# Patient Record
Sex: Male | Born: 1965 | Race: White | Hispanic: No | Marital: Married | Smoking: Never smoker
Health system: Southern US, Community
[De-identification: ages and names within clinical notes are randomized; demographics above are authoritative.]

## PROBLEM LIST (undated history)

## (undated) DIAGNOSIS — T7840XA Allergy, unspecified, initial encounter: Secondary | ICD-10-CM

## (undated) DIAGNOSIS — C801 Malignant (primary) neoplasm, unspecified: Secondary | ICD-10-CM

## (undated) DIAGNOSIS — C61 Malignant neoplasm of prostate: Secondary | ICD-10-CM

## (undated) HISTORY — DX: Malignant neoplasm of prostate: C61

## (undated) HISTORY — DX: Allergy, unspecified, initial encounter: T78.40XA

## (undated) HISTORY — DX: Malignant (primary) neoplasm, unspecified: C80.1

---

## 2006-10-31 ENCOUNTER — Ambulatory Visit: Payer: Self-pay | Admitting: Family Medicine

## 2006-11-07 ENCOUNTER — Ambulatory Visit: Payer: Self-pay | Admitting: Family Medicine

## 2007-11-10 ENCOUNTER — Ambulatory Visit: Payer: Self-pay | Admitting: Family Medicine

## 2008-11-18 ENCOUNTER — Ambulatory Visit: Payer: Self-pay | Admitting: Family Medicine

## 2009-11-19 ENCOUNTER — Ambulatory Visit: Payer: Self-pay | Admitting: Family Medicine

## 2010-10-21 ENCOUNTER — Encounter: Payer: Self-pay | Admitting: Family Medicine

## 2010-11-23 ENCOUNTER — Encounter (INDEPENDENT_AMBULATORY_CARE_PROVIDER_SITE_OTHER): Payer: BC Managed Care – PPO | Admitting: Family Medicine

## 2010-11-23 ENCOUNTER — Encounter: Payer: Self-pay | Admitting: Family Medicine

## 2010-11-23 DIAGNOSIS — Z0000 Encounter for general adult medical examination without abnormal findings: Secondary | ICD-10-CM

## 2010-11-23 DIAGNOSIS — J309 Allergic rhinitis, unspecified: Secondary | ICD-10-CM

## 2010-11-24 ENCOUNTER — Telehealth: Payer: Self-pay

## 2010-11-24 NOTE — Telephone Encounter (Signed)
Talked with pt about labs and mailed him a copy

## 2011-11-26 ENCOUNTER — Ambulatory Visit (INDEPENDENT_AMBULATORY_CARE_PROVIDER_SITE_OTHER): Payer: BC Managed Care – PPO | Admitting: Family Medicine

## 2011-11-26 ENCOUNTER — Encounter: Payer: Self-pay | Admitting: Family Medicine

## 2011-11-26 VITALS — BP 110/70 | HR 60 | Resp 12 | Ht 75.0 in | Wt 184.0 lb

## 2011-11-26 DIAGNOSIS — Z23 Encounter for immunization: Secondary | ICD-10-CM

## 2011-11-26 DIAGNOSIS — Z0000 Encounter for general adult medical examination without abnormal findings: Secondary | ICD-10-CM

## 2011-11-26 DIAGNOSIS — J309 Allergic rhinitis, unspecified: Secondary | ICD-10-CM

## 2011-11-26 LAB — HEMOCCULT GUIAC POC 1CARD (OFFICE)

## 2011-11-26 LAB — POCT URINALYSIS DIPSTICK
Blood, UA: NEGATIVE
Glucose, UA: NEGATIVE
Ketones, UA: NEGATIVE
Nitrite, UA: NEGATIVE
Spec Grav, UA: 1.015
pH, UA: 6

## 2011-11-26 NOTE — Patient Instructions (Signed)
Keep taking good care of yourself 

## 2011-11-26 NOTE — Progress Notes (Signed)
Subjective:    Patient ID: Jason Hinton, male    DOB: 04/27/1966, 46 y.o.   MRN: [id number]  HPI He is here for a complete examination. He does have underlying allergies and is using Claritin and Rhinocort with good results. He continues on a multivitamin. He has had some difficulty with abdominal gas and bloating that he has had for several years panel he was evaluated in the past for this and the evaluation was negative. He has made efforts to look at his food intake as well as possible celiac disease but to no avail. Any food because he abdominal bloating and gas. His marriage and home life are going well. 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   Review of Systems  Constitutional: Negative.   HENT: Negative.   Eyes: Negative.   Respiratory: Negative.   Cardiovascular: Negative.   Gastrointestinal: Positive for abdominal distention.  Genitourinary: Negative.   Musculoskeletal: Negative.   Skin: Negative.   Neurological: Negative.   Hematological: Negative.   Psychiatric/Behavioral: Negative.        Objective:   Physical Exam BP 110/70  Pulse 60  Resp 12  Ht 6\' 3"  (1.905 m)  Wt 184 lb (83.462 kg)  BMI 23.00 kg/m2  SpO2 98%  General Appearance:    Alert, cooperative, no distress, appears stated age  Head:    Normocephalic, without obvious abnormality, atraumatic  Eyes:    PERRL, conjunctiva/corneas clear, EOM's intact, fundi    benign  Ears:    Normal TM's and external ear canals  Nose:   Nares normal, mucosa normal, no drainage or sinus   tenderness  Throat:   Lips, mucosa, and tongue normal; teeth and gums normal  Neck:   Supple, no lymphadenopathy;  thyroid:  no   enlargement/tenderness/nodules; no carotid   bruit or JVD  Back:    Spine nontender, no curvature, ROM normal, no CVA     tenderness  Lungs:     Clear to auscultation bilaterally without wheezes, rales or     ronchi; respirations unlabored  Chest Wall:    No tenderness or deformity   Heart:    Regular rate and rhythm, S1 and S2 normal, no murmur, rub   or gallop  Breast Exam:    No chest wall tenderness, masses or gynecomastia  Abdomen:     Soft, non-tender, nondistended, normoactive bowel sounds,    no masses, no hepatosplenomegaly  Genitalia:    Normal male external genitalia without lesions.  Testicles without masses.  No inguinal hernias.  Rectal:    Normal sphincter tone, no masses or tenderness; guaiac negative stool.  Prostate smooth, no nodules, not enlarged.  Extremities:   No clubbing, cyanosis or edema  Pulses:   2+ and symmetric all extremities  Skin:   Skin color, texture, turgor normal, no rashes or lesions  Lymph nodes:   Cervical, supraclavicular, and axillary nodes normal  Neurologic:   CNII-XII intact, normal strength, sensation and gait; reflexes 2+ and symmetric throughout          Psych:   Normal mood, affect, hygiene and grooming.          Assessment & Plan:   1. Routine general medical examination at a health care facility  POCT Urinalysis Dipstick, Hemoccult - 1 Card (office)  2. Need for prophylactic vaccination and inoculation against other combinations of diseases  Tdap vaccine greater than or equal to 7yo IM  3. Allergic rhinitis, mild  I did recommend that he try Gas-X for his abdominal bloating to see if this will help. I will do routine blood screening on him next year. Encouraged him to continue with his active lifestyle.

## 2012-07-03 ENCOUNTER — Other Ambulatory Visit: Payer: BC Managed Care – PPO

## 2013-09-06 ENCOUNTER — Ambulatory Visit (INDEPENDENT_AMBULATORY_CARE_PROVIDER_SITE_OTHER): Payer: BC Managed Care – PPO | Admitting: Family Medicine

## 2013-09-06 ENCOUNTER — Encounter: Payer: Self-pay | Admitting: Family Medicine

## 2013-09-06 VITALS — BP 120/78 | HR 64 | Ht 76.0 in | Wt 194.0 lb

## 2013-09-06 DIAGNOSIS — J309 Allergic rhinitis, unspecified: Secondary | ICD-10-CM

## 2013-09-06 DIAGNOSIS — Z0000 Encounter for general adult medical examination without abnormal findings: Secondary | ICD-10-CM

## 2013-09-06 LAB — CBC WITH DIFFERENTIAL/PLATELET
Basophils Absolute: 0 10*3/uL (ref 0.0–0.1)
Basophils Relative: 1 % (ref 0–1)
EOS PCT: 3 % (ref 0–5)
Eosinophils Absolute: 0.1 10*3/uL (ref 0.0–0.7)
HEMATOCRIT: 43.4 % (ref 39.0–52.0)
Hemoglobin: 14.8 g/dL (ref 13.0–17.0)
LYMPHS ABS: 1 10*3/uL (ref 0.7–4.0)
LYMPHS PCT: 27 % (ref 12–46)
MCH: 29.6 pg (ref 26.0–34.0)
MCHC: 34.1 g/dL (ref 30.0–36.0)
MCV: 86.8 fL (ref 78.0–100.0)
Monocytes Absolute: 0.3 10*3/uL (ref 0.1–1.0)
Monocytes Relative: 8 % (ref 3–12)
NEUTROS ABS: 2.3 10*3/uL (ref 1.7–7.7)
Neutrophils Relative %: 61 % (ref 43–77)
PLATELETS: 160 10*3/uL (ref 150–400)
RBC: 5 MIL/uL (ref 4.22–5.81)
RDW: 13.6 % (ref 11.5–15.5)
WBC: 3.8 10*3/uL — AB (ref 4.0–10.5)

## 2013-09-06 LAB — POCT URINALYSIS DIPSTICK
Bilirubin, UA: NEGATIVE
Blood, UA: NEGATIVE
GLUCOSE UA: NEGATIVE
KETONES UA: NEGATIVE
LEUKOCYTES UA: NEGATIVE
Nitrite, UA: NEGATIVE
Protein, UA: NEGATIVE
SPEC GRAV UA: 1.01
UROBILINOGEN UA: NEGATIVE
pH, UA: 6

## 2013-09-06 LAB — COMPREHENSIVE METABOLIC PANEL
ALBUMIN: 4.7 g/dL (ref 3.5–5.2)
ALT: 22 U/L (ref 0–53)
AST: 20 U/L (ref 0–37)
Alkaline Phosphatase: 37 U/L — ABNORMAL LOW (ref 39–117)
BUN: 17 mg/dL (ref 6–23)
CALCIUM: 9.7 mg/dL (ref 8.4–10.5)
CHLORIDE: 104 meq/L (ref 96–112)
CO2: 30 meq/L (ref 19–32)
CREATININE: 1.05 mg/dL (ref 0.50–1.35)
Glucose, Bld: 95 mg/dL (ref 70–99)
POTASSIUM: 4.1 meq/L (ref 3.5–5.3)
Sodium: 140 mEq/L (ref 135–145)
TOTAL PROTEIN: 6.7 g/dL (ref 6.0–8.3)
Total Bilirubin: 0.8 mg/dL (ref 0.2–1.2)

## 2013-09-06 LAB — HEMOCCULT GUIAC POC 1CARD (OFFICE)

## 2013-09-06 LAB — LIPID PANEL
CHOL/HDL RATIO: 4.3 ratio
CHOLESTEROL: 212 mg/dL — AB (ref 0–200)
HDL: 49 mg/dL (ref >39)
LDL Cholesterol: 134 mg/dL — ABNORMAL HIGH (ref 0–99)
Triglycerides: 147 mg/dL (ref <150)
VLDL: 29 mg/dL (ref 0–40)

## 2013-09-06 NOTE — Progress Notes (Signed)
   Subjective:    Patient ID: Jason Hinton, male    DOB: 1965/11/04, 48 y.o.   MRN: [id number]  HPI He is here for complete examination. He does have underlying allergies and gets medications from [place] which apparently he can get because it's less expensive. He has no other concerns or complaints. He does not smoke, exercises regularly drinks very rarely. His work and home life are going well family and social history were reviewed. Immunizations were reviewed   Review of Systems  All other systems reviewed and are negative.       Objective:   Physical Exam BP 120/78  Pulse 64  Ht 6\' 4"  (1.93 m)  Wt 194 lb (87.998 kg)  BMI 23.62 kg/m2  General Appearance:    Alert, cooperative, no distress, appears stated age  Head:    Normocephalic, without obvious abnormality, atraumatic  Eyes:    PERRL, conjunctiva/corneas clear, EOM's intact, fundi    benign  Ears:    Normal TM's and external ear canals  Nose:   Nares normal, mucosa normal, no drainage or sinus   tenderness  Throat:   Lips, mucosa, and tongue normal; teeth and gums normal  Neck:   Supple, no lymphadenopathy;  thyroid:  no   enlargement/tenderness/nodules; no carotid   bruit or JVD  Back:    Spine nontender, no curvature, ROM normal, no CVA     tenderness  Lungs:     Clear to auscultation bilaterally without wheezes, rales or     ronchi; respirations unlabored  Chest Wall:    No tenderness or deformity   Heart:    Regular rate and rhythm, S1 and S2 normal, no murmur, rub   or gallop  Breast Exam:    No chest wall tenderness, masses or gynecomastia  Abdomen:     Soft, non-tender, nondistended, normoactive bowel sounds,    no masses, no hepatosplenomegaly  Genitalia:    Normal male external genitalia without lesions.  Testicles without masses.  No inguinal hernias.  Rectal:    Normal sphincter tone, no masses or tenderness; guaiac negative stool.  Prostate smooth, no nodules, not enlarged.  Extremities:   No clubbing,  cyanosis or edema  Pulses:   2+ and symmetric all extremities  Skin:   Skin color, texture, turgor normal, no rashes or lesions  Lymph nodes:   Cervical, supraclavicular, and axillary nodes normal  Neurologic:   CNII-XII intact, normal strength, sensation and gait; reflexes 2+ and symmetric throughout          Psych:   Normal mood, affect, hygiene and grooming.          Assessment & Plan:  Routine general medical examination at a health care facility - Plan: POCT urinalysis dipstick, CBC with Differential, Comprehensive metabolic panel, Lipid panel, POCT occult blood stool  Allergic rhinitis, mild

## 2015-01-27 ENCOUNTER — Ambulatory Visit (INDEPENDENT_AMBULATORY_CARE_PROVIDER_SITE_OTHER): Payer: BLUE CROSS/BLUE SHIELD | Admitting: Family Medicine

## 2015-01-27 ENCOUNTER — Encounter: Payer: Self-pay | Admitting: Family Medicine

## 2015-01-27 VITALS — BP 118/80 | HR 59 | Ht 75.0 in | Wt 200.0 lb

## 2015-01-27 DIAGNOSIS — H60542 Acute eczematoid otitis externa, left ear: Secondary | ICD-10-CM | POA: Diagnosis not present

## 2015-01-27 DIAGNOSIS — Z0000 Encounter for general adult medical examination without abnormal findings: Secondary | ICD-10-CM

## 2015-01-27 DIAGNOSIS — J309 Allergic rhinitis, unspecified: Secondary | ICD-10-CM | POA: Diagnosis not present

## 2015-01-27 DIAGNOSIS — M722 Plantar fascial fibromatosis: Secondary | ICD-10-CM

## 2015-01-27 LAB — COMPREHENSIVE METABOLIC PANEL
ALBUMIN: 4.6 g/dL (ref 3.5–5.2)
ALT: 20 U/L (ref 0–53)
AST: 17 U/L (ref 0–37)
Alkaline Phosphatase: 37 U/L — ABNORMAL LOW (ref 39–117)
BUN: 18 mg/dL (ref 6–23)
CALCIUM: 10 mg/dL (ref 8.4–10.5)
CHLORIDE: 105 meq/L (ref 96–112)
CO2: 26 meq/L (ref 19–32)
CREATININE: 0.92 mg/dL (ref 0.50–1.35)
GLUCOSE: 101 mg/dL — AB (ref 70–99)
Potassium: 4 mEq/L (ref 3.5–5.3)
SODIUM: 141 meq/L (ref 135–145)
Total Bilirubin: 0.7 mg/dL (ref 0.2–1.2)
Total Protein: 6.6 g/dL (ref 6.0–8.3)

## 2015-01-27 LAB — LIPID PANEL
Cholesterol: 234 mg/dL — ABNORMAL HIGH (ref 0–200)
HDL: 45 mg/dL (ref >=40)
LDL CALC: 158 mg/dL — AB (ref 0–99)
Total CHOL/HDL Ratio: 5.2 Ratio
Triglycerides: 157 mg/dL — ABNORMAL HIGH (ref <150)
VLDL: 31 mg/dL (ref 0–40)

## 2015-01-27 LAB — POCT URINALYSIS DIPSTICK
Bilirubin, UA: NEGATIVE
GLUCOSE UA: NEGATIVE
Ketones, UA: NEGATIVE
LEUKOCYTES UA: NEGATIVE
Nitrite, UA: NEGATIVE
PH UA: 6.5
Protein, UA: NEGATIVE
RBC UA: NEGATIVE
Spec Grav, UA: 1.025
Urobilinogen, UA: NEGATIVE

## 2015-01-27 LAB — CBC WITH DIFFERENTIAL/PLATELET
BASOS ABS: 0 10*3/uL (ref 0.0–0.1)
Basophils Relative: 0 % (ref 0–1)
EOS ABS: 0.1 10*3/uL (ref 0.0–0.7)
EOS PCT: 2 % (ref 0–5)
HEMATOCRIT: 43.1 % (ref 39.0–52.0)
Hemoglobin: 15.1 g/dL (ref 13.0–17.0)
LYMPHS ABS: 0.9 10*3/uL (ref 0.7–4.0)
LYMPHS PCT: 19 % (ref 12–46)
MCH: 30.4 pg (ref 26.0–34.0)
MCHC: 35 g/dL (ref 30.0–36.0)
MCV: 86.7 fL (ref 78.0–100.0)
MPV: 10.1 fL (ref 8.6–12.4)
Monocytes Absolute: 0.4 10*3/uL (ref 0.1–1.0)
Monocytes Relative: 9 % (ref 3–12)
Neutro Abs: 3.2 10*3/uL (ref 1.7–7.7)
Neutrophils Relative %: 70 % (ref 43–77)
Platelets: 157 10*3/uL (ref 150–400)
RBC: 4.97 MIL/uL (ref 4.22–5.81)
RDW: 13.3 % (ref 11.5–15.5)
WBC: 4.5 10*3/uL (ref 4.0–10.5)

## 2015-01-27 MED ORDER — BUDESONIDE 32 MCG/ACT NA SUSP: 1.0000 | Freq: Every day | NASAL | Status: DC

## 2015-01-27 NOTE — Progress Notes (Signed)
   Subjective:    Patient ID: Jason Hinton, male    DOB: 09/09/1965, 49 y.o.   MRN: [id number]  HPI He is here for complete examination. He does complain of some itching in the left ear. No drainage, pain or tinnitus.He is also had some left foot specifically heel discomfort. He has changed shoes and did do some stretching for this.His allergies are under good control. He has no other concerns or complaints. He does keep himself quite physically active playing soccer. His work and home life are going quite well. Family and social history as well as health maintenance and immunizations were reviewed.   Review of Systems  All other systems reviewed and are negative.      Objective:   Physical Exam BP 118/80 mmHg  Pulse 59  Ht 6\' 3"  (1.905 m)  Wt 200 lb (90.719 kg)  BMI 25.00 kg/m2  SpO2 97%  General Appearance:    Alert, cooperative, no distress, appears stated age  Head:    Normocephalic, without obvious abnormality, atraumatic  Eyes:    PERRL, conjunctiva/corneas clear, EOM's intact, fundi    benign  Ears:    Normal TM's and external ear canal on the left is slightly erythematous  Nose:   Nares normal, mucosa normal, no drainage or sinus   tenderness  Throat:   Lips, mucosa, and tongue normal; teeth and gums normal  Neck:   Supple, no lymphadenopathy;  thyroid:  no   enlargement/tenderness/nodules; no carotid   bruit or JVD  Back:    Spine nontender, no curvature, ROM normal, no CVA     tenderness  Lungs:     Clear to auscultation bilaterally without wheezes, rales or     ronchi; respirations unlabored  Chest Wall:    No tenderness or deformity   Heart:    Regular rate and rhythm, S1 and S2 normal, no murmur, rub   or gallop  Breast Exam:    No chest wall tenderness, masses or gynecomastia  Abdomen:     Soft, non-tender, nondistended, normoactive bowel sounds,    no masses, no hepatosplenomegaly  Genitalia:    Normal male external genitalia without lesions.  Testicles without  masses.  No inguinal hernias.  Rectal:   Deferred due to age <40 and lack of symptoms  Extremities:   No clubbing, cyanosis or edema.Slight tenderness palpation over the calcaneal spur on the left with normal motion of the foot.  Pulses:   2+ and symmetric all extremities  Skin:   Skin color, texture, turgor normal, no rashes or lesions  Lymph nodes:   Cervical, supraclavicular, and axillary nodes normal  Neurologic:   CNII-XII intact, normal strength, sensation and gait; reflexes 2+ and symmetric throughout          Psych:   Normal mood, affect, hygiene and grooming.          Assessment & Plan:  Routine general medical examination at a health care facility - Plan: POCT Urinalysis Dipstick, CBC with Differential/Platelet, Comprehensive metabolic panel, Lipid panel  Allergic rhinitis, mild - Plan: budesonide (RHINOCORT AQUA) 32 MCG/ACT nasal spray  Plantar fasciitis of left foot  Eczema of left external ear Commend cortisone cream for the ear. Also discussed orthotic, heel cups, stretching for his tendinitis. Also recommend make sure that his shoes fit him properly. Continue on allergy medications.

## 2016-03-30 ENCOUNTER — Ambulatory Visit (INDEPENDENT_AMBULATORY_CARE_PROVIDER_SITE_OTHER): Payer: BLUE CROSS/BLUE SHIELD | Admitting: Family Medicine

## 2016-03-30 ENCOUNTER — Encounter: Payer: Self-pay | Admitting: Family Medicine

## 2016-03-30 VITALS — BP 110/70 | HR 46 | Ht 75.0 in | Wt 198.6 lb

## 2016-03-30 DIAGNOSIS — Z0000 Encounter for general adult medical examination without abnormal findings: Secondary | ICD-10-CM | POA: Diagnosis not present

## 2016-03-30 DIAGNOSIS — L309 Dermatitis, unspecified: Secondary | ICD-10-CM

## 2016-03-30 DIAGNOSIS — J309 Allergic rhinitis, unspecified: Secondary | ICD-10-CM

## 2016-03-30 DIAGNOSIS — E785 Hyperlipidemia, unspecified: Secondary | ICD-10-CM | POA: Diagnosis not present

## 2016-03-30 LAB — POCT URINALYSIS DIPSTICK
Bilirubin, UA: NEGATIVE
Blood, UA: NEGATIVE
GLUCOSE UA: NEGATIVE
KETONES UA: NEGATIVE
Leukocytes, UA: NEGATIVE
Nitrite, UA: NEGATIVE
Protein, UA: NEGATIVE
Spec Grav, UA: >=1.03
UROBILINOGEN UA: NEGATIVE
pH, UA: 6

## 2016-03-30 LAB — LIPID PANEL
CHOL/HDL RATIO: 3.7 ratio (ref <=5.0)
Cholesterol: 206 mg/dL — ABNORMAL HIGH (ref 125–200)
HDL: 56 mg/dL (ref >=40)
LDL Cholesterol: 132 mg/dL — ABNORMAL HIGH (ref <130)
Triglycerides: 90 mg/dL (ref <150)
VLDL: 18 mg/dL (ref <30)

## 2016-03-30 LAB — CBC WITH DIFFERENTIAL/PLATELET
BASOS PCT: 1 %
Basophils Absolute: 44 cells/uL (ref 0–200)
EOS ABS: 132 {cells}/uL (ref 15–500)
Eosinophils Relative: 3 %
HCT: 43.1 % (ref 38.5–50.0)
HEMOGLOBIN: 14.9 g/dL (ref 13.2–17.1)
LYMPHS ABS: 880 {cells}/uL (ref 850–3900)
Lymphocytes Relative: 20 %
MCH: 29.8 pg (ref 27.0–33.0)
MCHC: 34.6 g/dL (ref 32.0–36.0)
MCV: 86.2 fL (ref 80.0–100.0)
MPV: 10 fL (ref 7.5–12.5)
Monocytes Absolute: 396 cells/uL (ref 200–950)
Monocytes Relative: 9 %
NEUTROS PCT: 67 %
Neutro Abs: 2948 cells/uL (ref 1500–7800)
Platelets: 128 10*3/uL — ABNORMAL LOW (ref 140–400)
RBC: 5 MIL/uL (ref 4.20–5.80)
RDW: 12.8 % (ref 11.0–15.0)
WBC: 4.4 10*3/uL (ref 4.0–10.5)

## 2016-03-30 LAB — COMPREHENSIVE METABOLIC PANEL
ALBUMIN: 4.6 g/dL (ref 3.6–5.1)
ALK PHOS: 43 U/L (ref 40–115)
ALT: 16 U/L (ref 9–46)
AST: 20 U/L (ref 10–40)
BILIRUBIN TOTAL: 0.8 mg/dL (ref 0.2–1.2)
BUN: 20 mg/dL (ref 7–25)
CALCIUM: 9.7 mg/dL (ref 8.6–10.3)
CO2: 26 mmol/L (ref 20–31)
Chloride: 106 mmol/L (ref 98–110)
Creat: 1 mg/dL (ref 0.60–1.35)
Glucose, Bld: 100 mg/dL — ABNORMAL HIGH (ref 65–99)
POTASSIUM: 3.9 mmol/L (ref 3.5–5.3)
Sodium: 142 mmol/L (ref 135–146)
Total Protein: 6.6 g/dL (ref 6.1–8.1)

## 2016-03-30 NOTE — Progress Notes (Signed)
   Subjective:    Patient ID: Jason Hinton, male    DOB: 07-Dec-1965, 50 y.o.   MRN: [id number]  HPI He is here for a complete examination he does have difficulty with itching in his ears. Does have underlying allergies and does use OTC medications with good results for this. Also recently he was seen in an urgent care center for urinary type symptoms however at this time he is not having any trouble. He also is now in a new office-based and has had some questionable visual/dizziness type symptoms that he is unclear of. This point they are doing better. Review of his record does show elevated lipids but negative family history for any major cardiac issues. His work and home life are going quite well. Family and social history as well as health maintenance and immunizations were reviewed. He's had no chest pain, shortness of breath, GI issues   Review of Systems  All other systems reviewed and are negative.      Objective:   Physical Exam BP 110/70   Pulse (!) 46   Ht 6\' 3"  (1.905 m)   Wt 198 lb 9.6 oz (90.1 kg)   SpO2 97%   BMI 24.82 kg/m   General Appearance:    Alert, cooperative, no distress, appears stated age  Head:    Normocephalic, without obvious abnormality, atraumatic  Eyes:    PERRL, conjunctiva/corneas clear, EOM's intact, fundi    benign  Ears:    Normal TM's,external ear canals show slight erythema and scaling   Nose:   Nares normal, mucosa normal, no drainage or sinus   tenderness  Throat:   Lips, mucosa, and tongue normal; teeth and gums normal        Lungs:     Clear to auscultation bilaterally without wheezes, rales or     ronchi; respirations unlabored  Chest Wall:    No tenderness or deformity   Heart:    Regular rate and rhythm, S1 and S2 normal, no murmur, rub   or gallop     Abdomen:     Soft, non-tender, nondistended, normoactive bowel sounds,    no masses, no hepatosplenomegaly  Genitalia:    Normal male external genitalia without lesions.  Testicles  without masses.  No inguinal hernias.  Rectal:     Extremities:   No clubbing, cyanosis or edema  Pulses:   2+ and symmetric all extremities  Skin:   Skin color, texture, turgor normal, no rashes or lesions  Lymph nodes:   Cervical, supraclavicular, and axillary nodes normal  Neurologic:   CNII-XII intact, normal strength, sensation and gait; reflexes 2+ and symmetric throughout          Psych:   Normal mood, affect, hygiene and grooming.          Assessment & Plan:  Routine general medical examination at a health care facility - Plan: POCT Urinalysis Dipstick, CBC with Differential/Platelet, Comprehensive metabolic panel, Lipid panel  Allergic rhinitis, mild  Eczema  Hyperlipidemia - Plan: Lipid panel Ackerman cortisone cream for the eczema in his ears. He will continue to treat his allergies as needed. He is taking excellent care of himself. He is also to keep track of his symptoms in regard to work versus the computer. He is nearsighted so that should not be an issue. If he has further difficulty with this he will come back for recheck. He is also to call after he turns 51 for referral for colonoscopy.

## 2017-05-30 ENCOUNTER — Ambulatory Visit (INDEPENDENT_AMBULATORY_CARE_PROVIDER_SITE_OTHER): Payer: BLUE CROSS/BLUE SHIELD | Admitting: Family Medicine

## 2017-05-30 ENCOUNTER — Encounter: Payer: Self-pay | Admitting: Family Medicine

## 2017-05-30 VITALS — BP 112/60 | HR 54 | Resp 16 | Ht 74.5 in | Wt 191.4 lb

## 2017-05-30 DIAGNOSIS — J309 Allergic rhinitis, unspecified: Secondary | ICD-10-CM

## 2017-05-30 DIAGNOSIS — Z0000 Encounter for general adult medical examination without abnormal findings: Secondary | ICD-10-CM | POA: Diagnosis not present

## 2017-05-30 DIAGNOSIS — E785 Hyperlipidemia, unspecified: Secondary | ICD-10-CM

## 2017-05-30 DIAGNOSIS — L309 Dermatitis, unspecified: Secondary | ICD-10-CM

## 2017-05-30 LAB — POCT URINALYSIS DIP (PROADVANTAGE DEVICE)
BILIRUBIN UA: NEGATIVE
GLUCOSE UA: NEGATIVE mg/dL
Leukocytes, UA: NEGATIVE
Nitrite, UA: NEGATIVE
Protein Ur, POC: NEGATIVE mg/dL
RBC UA: NEGATIVE
SPECIFIC GRAVITY, URINE: 1.03
Urobilinogen, Ur: NEGATIVE
pH, UA: 6 (ref 5.0–8.0)

## 2017-05-30 NOTE — Progress Notes (Signed)
   Subjective:    Patient ID: Jason Hinton, male    DOB: [birth year], 51 y.o.   MRN: [id number]  HPI He is here for a complete examination.  He does have underlying allergies and does use Rhinocort.  The seems to have this under adequate control.  He also complains of some difficulty with ear scaling and itching and does use a cortisone cream on this on an as-needed basis.  Otherwise he has no particular concerns or complaints.  He does keep himself physically active.  He is marriage and home life are going well.  Otherwise family and social history as well as health maintenance and immunizations was reviewed.   Review of Systems  All other systems reviewed and are negative.      Objective:   Physical Exam BP 112/60   Pulse (!) 54   Resp 16   Ht 6' 2.5" (1.892 m)   Wt 191 lb 6.4 oz (86.8 kg)   SpO2 99%   BMI 24.25 kg/m   General Appearance:    Alert, cooperative, no distress, appears stated age  Head:    Normocephalic, without obvious abnormality, atraumatic  Eyes:    PERRL, conjunctiva/corneas clear, EOM's intact, fundi    benign  Ears:    Normal TM's and external ear canals  Nose:   Nares normal, mucosa normal, no drainage or sinus   tenderness  Throat:   Lips, mucosa, and tongue normal; teeth and gums normal  Neck:   Supple, no lymphadenopathy;  thyroid:  no   enlargement/tenderness/nodules; no carotid   bruit or JVD     Lungs:     Clear to auscultation bilaterally without wheezes, rales or     ronchi; respirations unlabored      Heart:    Regular rate and rhythm, S1 and S2 normal, no murmur, rub   or gallop     Abdomen:     Soft, non-tender, nondistended, normoactive bowel sounds,    no masses, no hepatosplenomegaly  Genitalia:   Deferred  Rectal:   Deferred  Extremities:   No clubbing, cyanosis or edema  Pulses:   2+ and symmetric all extremities  Skin:   Skin color, texture, turgor normal, no rashes or lesions  Lymph nodes:   Cervical, supraclavicular, and  axillary nodes normal  Neurologic:   CNII-XII intact, normal strength, sensation and gait; reflexes 2+ and symmetric throughout          Psych:   Normal mood, affect, hygiene and grooming.           Assessment & Plan:  Annual physical exam - Plan: POCT Urinalysis DIP (Proadvantage Device), CBC with Differential/Platelet, Comprehensive metabolic panel, Lipid panel  Allergic rhinitis, mild  Eczema, unspecified type  Hyperlipidemia, unspecified hyperlipidemia type - Plan: Lipid panel  Recommend cortisone cream for the eczema things he will continue on his allergy medications.  Discussed getting colonoscopy explained to colonoscopy versus [place]ho guard.  He will wait until next year and hopefully his insurance would not cover this.  Encouraged him to continue to take good care of himself.  Refused flu shot

## 2017-05-31 LAB — COMPREHENSIVE METABOLIC PANEL
AG RATIO: 2 (calc) (ref 1.0–2.5)
ALKALINE PHOSPHATASE (APISO): 45 U/L (ref 40–115)
ALT: 23 U/L (ref 9–46)
AST: 34 U/L (ref 10–35)
Albumin: 4.6 g/dL (ref 3.6–5.1)
BILIRUBIN TOTAL: 1 mg/dL (ref 0.2–1.2)
BUN: 23 mg/dL (ref 7–25)
CALCIUM: 9.6 mg/dL (ref 8.6–10.3)
CHLORIDE: 106 mmol/L (ref 98–110)
CO2: 27 mmol/L (ref 20–32)
Creat: 1.07 mg/dL (ref 0.70–1.33)
GLOBULIN: 2.3 g/dL (ref 1.9–3.7)
Glucose, Bld: 94 mg/dL (ref 65–99)
Potassium: 3.8 mmol/L (ref 3.5–5.3)
Sodium: 140 mmol/L (ref 135–146)
Total Protein: 6.9 g/dL (ref 6.1–8.1)

## 2017-05-31 LAB — CBC WITH DIFFERENTIAL/PLATELET
BASOS PCT: 0.4 %
Basophils Absolute: 20 cells/uL (ref 0–200)
EOS ABS: 122 {cells}/uL (ref 15–500)
Eosinophils Relative: 2.4 %
HEMATOCRIT: 43 % (ref 38.5–50.0)
Hemoglobin: 14.8 g/dL (ref 13.2–17.1)
Lymphs Abs: 979 cells/uL (ref 850–3900)
MCH: 29.4 pg (ref 27.0–33.0)
MCHC: 34.4 g/dL (ref 32.0–36.0)
MCV: 85.5 fL (ref 80.0–100.0)
MPV: 11.1 fL (ref 7.5–12.5)
Monocytes Relative: 8.7 %
NEUTROS PCT: 69.3 %
Neutro Abs: 3534 cells/uL (ref 1500–7800)
PLATELETS: 177 10*3/uL (ref 140–400)
RBC: 5.03 10*6/uL (ref 4.20–5.80)
RDW: 12.2 % (ref 11.0–15.0)
TOTAL LYMPHOCYTE: 19.2 %
WBC: 5.1 10*3/uL (ref 3.8–10.8)
WBCMIX: 444 {cells}/uL (ref 200–950)

## 2017-05-31 LAB — LIPID PANEL
CHOLESTEROL: 223 mg/dL — AB (ref <200)
HDL: 67 mg/dL (ref >40)
LDL Cholesterol (Calc): 141 mg/dL (calc) — ABNORMAL HIGH
Non-HDL Cholesterol (Calc): 156 mg/dL (calc) — ABNORMAL HIGH (ref <130)
Total CHOL/HDL Ratio: 3.3 (calc) (ref <5.0)
Triglycerides: 55 mg/dL (ref <150)

## 2018-06-01 ENCOUNTER — Ambulatory Visit (INDEPENDENT_AMBULATORY_CARE_PROVIDER_SITE_OTHER): Payer: BLUE CROSS/BLUE SHIELD | Admitting: Family Medicine

## 2018-06-01 ENCOUNTER — Encounter: Payer: Self-pay | Admitting: Family Medicine

## 2018-06-01 VITALS — BP 104/72 | HR 60 | Temp 97.6°F | Ht 74.5 in | Wt 184.6 lb

## 2018-06-01 DIAGNOSIS — J309 Allergic rhinitis, unspecified: Secondary | ICD-10-CM

## 2018-06-01 DIAGNOSIS — E785 Hyperlipidemia, unspecified: Secondary | ICD-10-CM | POA: Diagnosis not present

## 2018-06-01 DIAGNOSIS — Z0000 Encounter for general adult medical examination without abnormal findings: Secondary | ICD-10-CM

## 2018-06-01 DIAGNOSIS — Z23 Encounter for immunization: Secondary | ICD-10-CM

## 2018-06-01 DIAGNOSIS — Z1211 Encounter for screening for malignant neoplasm of colon: Secondary | ICD-10-CM | POA: Diagnosis not present

## 2018-06-01 LAB — LIPID PANEL
CHOL/HDL RATIO: 3.9 ratio (ref 0.0–5.0)
Cholesterol, Total: 160 mg/dL (ref 100–199)
HDL: 41 mg/dL (ref >39)
LDL Calculated: 107 mg/dL — ABNORMAL HIGH (ref 0–99)
TRIGLYCERIDES: 58 mg/dL (ref 0–149)
VLDL CHOLESTEROL CAL: 12 mg/dL (ref 5–40)

## 2018-06-01 LAB — POCT URINALYSIS DIP (PROADVANTAGE DEVICE)
Bilirubin, UA: NEGATIVE
GLUCOSE UA: NEGATIVE mg/dL
Ketones, POC UA: NEGATIVE mg/dL
Leukocytes, UA: NEGATIVE
NITRITE UA: NEGATIVE
Protein Ur, POC: NEGATIVE mg/dL
RBC UA: NEGATIVE
Specific Gravity, Urine: 1.025
Urobilinogen, Ur: 3.5
pH, UA: 6 (ref 5.0–8.0)

## 2018-06-01 NOTE — Progress Notes (Signed)
   Subjective:    Patient ID: Jason Hinton, male    DOB: 11-01-1965, 52 y.o.   MRN: [id number]  HPI He is here for complete examination.  He does have underlying allergies that are under good control with Rhinocort and Claritin.  Recently he has had URI symptoms but is getting better.  He is also had intermittent difficulty with constipation and occasionally seeing bright red blood per rectum with that.  He said no abdominal pain, nausea, vomiting. He does have a history of hyperlipidemia but is not on any medications.  He does eat a well-balanced diet and exercises regularly.  His work and home life are going well.  Family and social history as well as health maintenance and immunizations was reviewed.  No family history of colon cancer or polyps.   Review of Systems  All other systems reviewed and are negative.      Objective:   Physical Exam BP 104/72 (BP Location: Left Arm, Patient Position: Sitting)   Pulse 60   Temp 97.6 F (36.4 C)   Ht 6' 2.5" (1.892 m)   Wt 184 lb 9.6 oz (83.7 kg)   SpO2 98%   BMI 23.38 kg/m   General Appearance:    Alert, cooperative, no distress, appears stated age  Head:    Normocephalic, without obvious abnormality, atraumatic  Eyes:    PERRL, conjunctiva/corneas clear, EOM's intact, fundi    benign  Ears:    Normal TM's and external ear canals  Nose:   Nares normal, mucosa normal, no drainage or sinus   tenderness  Throat:   Lips, mucosa, and tongue normal; teeth and gums normal  Neck:   Supple, no lymphadenopathy;  thyroid:  no   enlargement/tenderness/nodules; no carotid   bruit or JVD     Lungs:     Clear to auscultation bilaterally without wheezes, rales or     ronchi; respirations unlabored      Heart:    Regular rate and rhythm, S1 and S2 normal, no murmur, rub   or gallop     Abdomen:     Soft, non-tender, nondistended, normoactive bowel sounds,    no masses, no hepatosplenomegaly  Genitalia:   Deferred  Rectal:    Normal sphincter  tone, no masses or tenderness;   Extremities:   No clubbing, cyanosis or edema  Pulses:   2+ and symmetric all extremities  Skin:   Skin color, texture, turgor normal, no rashes or lesions  Lymph nodes:   Cervical, supraclavicular, and axillary nodes normal  Neurologic:   CNII-XII intact, normal strength, sensation and gait; reflexes 2+ and symmetric throughout          Psych:   Normal mood, affect, hygiene and grooming.          Assessment & Plan:  Routine general medical examination at a health care facility - Plan: POCT Urinalysis DIP (Proadvantage Device)  Allergic rhinitis, mild  Hyperlipidemia, unspecified hyperlipidemia type - Plan: Lipid panel  Screening for colon cancer - Plan: Cologuard  Need for influenza vaccination - Plan: Flu Vaccine QUAD 6+ mos PF IM (Fluarix Quad PF) He will continue on his allergy medications.  I will check his lipids. Explained that the intermittent bright red blood is probably related to constipation and rectal fissure.  Recommend he come here when he has another episode for further evaluation and definitive diagnosis.

## 2018-06-02 ENCOUNTER — Encounter: Payer: Self-pay | Admitting: Family Medicine

## 2018-06-04 LAB — COLOGUARD: Cologuard: NEGATIVE

## 2019-05-03 ENCOUNTER — Other Ambulatory Visit: Payer: Self-pay

## 2019-05-03 DIAGNOSIS — Z20822 Contact with and (suspected) exposure to covid-19: Secondary | ICD-10-CM

## 2019-05-05 LAB — NOVEL CORONAVIRUS, NAA: SARS-CoV-2, NAA: NOT DETECTED

## 2019-06-07 ENCOUNTER — Encounter: Payer: BLUE CROSS/BLUE SHIELD | Admitting: Family Medicine

## 2019-08-15 ENCOUNTER — Other Ambulatory Visit: Payer: Self-pay

## 2019-08-15 ENCOUNTER — Ambulatory Visit (INDEPENDENT_AMBULATORY_CARE_PROVIDER_SITE_OTHER): Payer: 59 | Admitting: Family Medicine

## 2019-08-15 ENCOUNTER — Encounter: Payer: Self-pay | Admitting: Family Medicine

## 2019-08-15 VITALS — BP 102/68 | HR 57 | Temp 96.8°F | Ht 74.5 in | Wt 185.6 lb

## 2019-08-15 DIAGNOSIS — Z0000 Encounter for general adult medical examination without abnormal findings: Secondary | ICD-10-CM | POA: Diagnosis not present

## 2019-08-15 DIAGNOSIS — E785 Hyperlipidemia, unspecified: Secondary | ICD-10-CM | POA: Diagnosis not present

## 2019-08-15 DIAGNOSIS — J309 Allergic rhinitis, unspecified: Secondary | ICD-10-CM

## 2019-08-15 DIAGNOSIS — N419 Inflammatory disease of prostate, unspecified: Secondary | ICD-10-CM

## 2019-08-15 DIAGNOSIS — Z23 Encounter for immunization: Secondary | ICD-10-CM

## 2019-08-15 LAB — POCT URINALYSIS DIP (PROADVANTAGE DEVICE)
Bilirubin, UA: NEGATIVE
Blood, UA: NEGATIVE
Glucose, UA: NEGATIVE mg/dL
Ketones, POC UA: NEGATIVE mg/dL
Leukocytes, UA: NEGATIVE
Nitrite, UA: NEGATIVE
Protein Ur, POC: NEGATIVE mg/dL
Specific Gravity, Urine: 1.02
Urobilinogen, Ur: 0.2
pH, UA: 6 (ref 5.0–8.0)

## 2019-08-15 MED ORDER — SULFAMETHOXAZOLE-TRIMETHOPRIM 800-160 MG PO TABS: 1.0000 | ORAL_TABLET | Freq: Two times a day (BID) | ORAL | 0 refills | Status: DC

## 2019-08-15 NOTE — Progress Notes (Signed)
   Subjective:    Patient ID: Jason Hinton, male    DOB: Apr 04, 1966, 54 y.o.   MRN: [id number]  HPI He is here for complete examination.  He does complain of intermittent difficulty with urinary frequency and slight discomfort but no real dysuria.  He also has noted slight decrease in stream as well as some urgency.  This tends to come and go.  No discharge is noted.  He did note that in November he did see rust colored semen however this did go away relatively quickly.  He has cut back on his bike riding to see if this would help.  He keeps himself very physically active.  He does have underlying allergies and treats them with over-the-counter medications with good results.  Presently he is unemployed due to [employer].  This has been quite stressful on him and his family.  Otherwise family and social history as well as health maintenance and immunizations was reviewed.   Review of Systems  All other systems reviewed and are negative.      Objective:   Physical Exam Alert and in no distress. Tympanic membranes and canals are normal. Pharyngeal area is normal. Neck is supple without adenopathy or thyromegaly. Cardiac exam shows a regular sinus rhythm without murmurs or gallops. Lungs are clear to auscultation.  Abdominal exam shows no hepatosplenomegaly masses or tenderness.  Lowella Fairy shows normal uncircumcised male.  Testes normal.  Rectal exam shows a boggy and slightly tender prostate.  Compression of the prostate reproduces his discomfort.  Urinalysis was negative        Assessment & Plan:  Routine general medical examination at a health care facility - Plan: CBC with Differential/Platelet, Comprehensive metabolic panel, Lipid panel, POCT Urinalysis DIP (Proadvantage Device)  Allergic rhinitis, mild  Hyperlipidemia, unspecified hyperlipidemia type  Need for influenza vaccination - Plan: Flu Vaccine QUAD 6+ mos PF IM (Fluarix Quad PF)  Prostatitis, unspecified prostatitis type - Plan:  sulfamethoxazole-trimethoprim (BACTRIM DS) 800-160 MG tablet I explained that I thought the rose-colored semen and his present symptoms were prostate related.  I will treat him with 2 weeks of antibiotic.  He will call if not entirely back to normal.  Continue on his allergy medications.

## 2019-08-16 LAB — CBC WITH DIFFERENTIAL/PLATELET
Basophils Absolute: 0 10*3/uL (ref 0.0–0.2)
Basos: 1 %
EOS (ABSOLUTE): 0.1 10*3/uL (ref 0.0–0.4)
Eos: 2 %
Hematocrit: 42.5 % (ref 37.5–51.0)
Hemoglobin: 14.9 g/dL (ref 13.0–17.7)
Immature Grans (Abs): 0 10*3/uL (ref 0.0–0.1)
Immature Granulocytes: 0 %
Lymphocytes Absolute: 0.8 10*3/uL (ref 0.7–3.1)
Lymphs: 20 %
MCH: 30.7 pg (ref 26.6–33.0)
MCHC: 35.1 g/dL (ref 31.5–35.7)
MCV: 87 fL (ref 79–97)
Monocytes Absolute: 0.4 10*3/uL (ref 0.1–0.9)
Monocytes: 10 %
Neutrophils Absolute: 2.7 10*3/uL (ref 1.4–7.0)
Neutrophils: 67 %
Platelets: 167 10*3/uL (ref 150–450)
RBC: 4.86 x10E6/uL (ref 4.14–5.80)
RDW: 12.5 % (ref 11.6–15.4)
WBC: 4.1 10*3/uL (ref 3.4–10.8)

## 2019-08-16 LAB — COMPREHENSIVE METABOLIC PANEL
ALT: 19 IU/L (ref 0–44)
AST: 19 IU/L (ref 0–40)
Albumin/Globulin Ratio: 2.5 — ABNORMAL HIGH (ref 1.2–2.2)
Albumin: 4.7 g/dL (ref 3.8–4.9)
Alkaline Phosphatase: 49 IU/L (ref 39–117)
BUN/Creatinine Ratio: 17 (ref 9–20)
BUN: 18 mg/dL (ref 6–24)
Bilirubin Total: 0.6 mg/dL (ref 0.0–1.2)
CO2: 24 mmol/L (ref 20–29)
Calcium: 10 mg/dL (ref 8.7–10.2)
Chloride: 104 mmol/L (ref 96–106)
Creatinine, Ser: 1.03 mg/dL (ref 0.76–1.27)
GFR calc Af Amer: 95 mL/min/{1.73_m2} (ref >59)
GFR calc non Af Amer: 83 mL/min/{1.73_m2} (ref >59)
Globulin, Total: 1.9 g/dL (ref 1.5–4.5)
Glucose: 101 mg/dL — ABNORMAL HIGH (ref 65–99)
Potassium: 4.2 mmol/L (ref 3.5–5.2)
Sodium: 140 mmol/L (ref 134–144)
Total Protein: 6.6 g/dL (ref 6.0–8.5)

## 2019-08-16 LAB — LIPID PANEL
Chol/HDL Ratio: 3.9 ratio (ref 0.0–5.0)
Cholesterol, Total: 241 mg/dL — ABNORMAL HIGH (ref 100–199)
HDL: 62 mg/dL (ref >39)
LDL Chol Calc (NIH): 162 mg/dL — ABNORMAL HIGH (ref 0–99)
Triglycerides: 100 mg/dL (ref 0–149)
VLDL Cholesterol Cal: 17 mg/dL (ref 5–40)

## 2019-09-17 ENCOUNTER — Other Ambulatory Visit: Payer: Self-pay

## 2019-09-17 ENCOUNTER — Encounter (INDEPENDENT_AMBULATORY_CARE_PROVIDER_SITE_OTHER): Payer: Self-pay

## 2019-09-17 ENCOUNTER — Encounter: Payer: Self-pay | Admitting: Family Medicine

## 2019-09-17 DIAGNOSIS — N419 Inflammatory disease of prostate, unspecified: Secondary | ICD-10-CM

## 2020-02-21 ENCOUNTER — Encounter: Payer: Self-pay | Admitting: Family Medicine

## 2020-09-19 DIAGNOSIS — B001 Herpesviral vesicular dermatitis: Secondary | ICD-10-CM | POA: Diagnosis not present

## 2021-04-15 ENCOUNTER — Other Ambulatory Visit: Payer: Self-pay | Admitting: *Deleted

## 2021-04-15 ENCOUNTER — Encounter: Payer: Self-pay | Admitting: Family Medicine

## 2021-04-15 ENCOUNTER — Other Ambulatory Visit: Payer: Self-pay | Admitting: Urology

## 2021-04-15 ENCOUNTER — Other Ambulatory Visit: Payer: Self-pay

## 2021-04-15 DIAGNOSIS — Z125 Encounter for screening for malignant neoplasm of prostate: Secondary | ICD-10-CM

## 2021-04-15 NOTE — Progress Notes (Signed)
Patient: Jason Hinton           Date of Birth: 23-Aug-1965           MRN: [id number] Visit Date: 04/15/2021 PCP: Denita Lung, MD  Prostate Cancer Screening Date of last physical exam: 03/27/20 Date of last rectal exam:  (2021) Have you ever had any of the following?: None Have you ever had or been told you have an allergy to latex products?: No Are you currently taking any natural prostate preparations?: No Are you currently experiencing any urinary symptoms?: No  Prostate Exam Exam not completed. PSA Only.  Patient's History Patient Active Problem List   Diagnosis Date Noted   Hyperlipidemia 08/15/2019   Eczema 05/30/2017   Allergic rhinitis, mild 11/26/2011   Past Medical History:  Diagnosis Date   Allergic rhinitis    Allergy     No family history on file.  Social History   Occupational History   Not on file  Tobacco Use   Smoking status: Not on file   Smokeless tobacco: Never  Vaping Use   Vaping Use: Never used  Substance and Sexual Activity   Alcohol use: Yes    Alcohol/week: 5.0 standard drinks    Types: 5 Cans of beer per week   Drug use: No   Sexual activity: Yes

## 2021-04-16 ENCOUNTER — Telehealth: Payer: Self-pay

## 2021-04-16 LAB — PSA: Prostate Specific Ag, Serum: 4.5 ng/mL — ABNORMAL HIGH (ref 0.0–4.0)

## 2021-04-16 NOTE — Telephone Encounter (Signed)
Attempted to contact patient regarding lab (PSA) results. Left message on voicemail requesting a return call.

## 2021-04-17 ENCOUNTER — Telehealth: Payer: Self-pay

## 2021-04-17 NOTE — Telephone Encounter (Signed)
Patient returned call, was informed PSA level was elevated, needs referral to urologist. Patient was offered referral to Dr. Roxy Horseman office @ [facility],and accepted referral. Referral information completed and sent to Etheleen Sia, RN.

## 2021-04-24 ENCOUNTER — Telehealth: Payer: Self-pay | Admitting: *Deleted

## 2021-04-24 NOTE — Telephone Encounter (Signed)
Referral for elevated PSA sent to [facility] on 04/17/21. Appointment scheduled with Dr. Diona Fanti on 04/27/2021 at 1545. Patient aware of appointment.

## 2021-04-27 DIAGNOSIS — R972 Elevated prostate specific antigen [PSA]: Secondary | ICD-10-CM | POA: Diagnosis not present

## 2021-06-09 DIAGNOSIS — R972 Elevated prostate specific antigen [PSA]: Secondary | ICD-10-CM | POA: Diagnosis not present

## 2021-08-03 ENCOUNTER — Encounter: Payer: 59 | Admitting: Family Medicine

## 2021-08-19 DIAGNOSIS — R972 Elevated prostate specific antigen [PSA]: Secondary | ICD-10-CM | POA: Diagnosis not present

## 2021-08-19 DIAGNOSIS — C61 Malignant neoplasm of prostate: Secondary | ICD-10-CM | POA: Diagnosis not present

## 2021-08-19 HISTORY — PX: SATURATION BIOPSY OF PROSTATE: SHX2375

## 2021-09-03 DIAGNOSIS — C61 Malignant neoplasm of prostate: Secondary | ICD-10-CM | POA: Insufficient documentation

## 2021-09-14 DIAGNOSIS — C61 Malignant neoplasm of prostate: Secondary | ICD-10-CM | POA: Diagnosis not present

## 2021-09-16 ENCOUNTER — Telehealth: Payer: Self-pay | Admitting: *Deleted

## 2021-09-16 NOTE — Telephone Encounter (Signed)
Patient was returning your call. Said he had a biopsy and you had called him to ask if he had any questions. He does want to speak with you. He does know you are out of town and said it is not an emergency. ?

## 2021-09-24 ENCOUNTER — Other Ambulatory Visit: Payer: Self-pay | Admitting: Urology

## 2021-09-24 ENCOUNTER — Encounter: Payer: Self-pay | Admitting: Family Medicine

## 2021-09-24 DIAGNOSIS — C61 Malignant neoplasm of prostate: Secondary | ICD-10-CM

## 2021-10-20 ENCOUNTER — Ambulatory Visit (INDEPENDENT_AMBULATORY_CARE_PROVIDER_SITE_OTHER): Payer: BC Managed Care – PPO | Admitting: Family Medicine

## 2021-10-20 ENCOUNTER — Encounter: Payer: Self-pay | Admitting: Family Medicine

## 2021-10-20 ENCOUNTER — Telehealth: Payer: Self-pay

## 2021-10-20 VITALS — BP 120/70 | HR 62 | Ht 76.0 in | Wt 190.0 lb

## 2021-10-20 DIAGNOSIS — Z0000 Encounter for general adult medical examination without abnormal findings: Secondary | ICD-10-CM | POA: Diagnosis not present

## 2021-10-20 DIAGNOSIS — J309 Allergic rhinitis, unspecified: Secondary | ICD-10-CM

## 2021-10-20 DIAGNOSIS — Z1211 Encounter for screening for malignant neoplasm of colon: Secondary | ICD-10-CM

## 2021-10-20 DIAGNOSIS — Z23 Encounter for immunization: Secondary | ICD-10-CM | POA: Diagnosis not present

## 2021-10-20 DIAGNOSIS — E785 Hyperlipidemia, unspecified: Secondary | ICD-10-CM | POA: Diagnosis not present

## 2021-10-20 DIAGNOSIS — Z630 Problems in relationship with spouse or partner: Secondary | ICD-10-CM

## 2021-10-20 DIAGNOSIS — Z1159 Encounter for screening for other viral diseases: Secondary | ICD-10-CM

## 2021-10-20 DIAGNOSIS — C61 Malignant neoplasm of prostate: Secondary | ICD-10-CM

## 2021-10-20 NOTE — Progress Notes (Signed)
? ?  Subjective:  ? ? Patient ID: Jason Hinton, male    DOB: 1965-09-22, 56 y.o.   MRN: [id number] ? ?HPI ?He is here for a complete examination.  He was diagnosed recently with prostate cancer with a Gleason score of 6.  Apparently his urologist discussed surgery with him.  He then discussed it with physicians in his home country and has decided to do active surveillance.  His allergies seem to be under good control.  He also has a history of hyperlipidemia.  Apparently his wife is interested in him having a hormone panel done.  He cites issues after talking to him which are more psychological in nature.  He and his wife are apparently involved in counseling on that.  He does have a daughter that will be going away to college this year.  Otherwise his family and social history is health maintenance and immunizations was reviewed. ? ? ?Review of Systems  ?All other systems reviewed and are negative. ? ?   ?Objective:  ? Physical Exam ?Alert and in no distress. Tympanic membranes and canals are normal. Pharyngeal area is normal. Neck is supple without adenopathy or thyromegaly. Cardiac exam shows a regular sinus rhythm without murmurs or gallops. Lungs are clear to auscultation. ? ? ? ? ?   ?Assessment & Plan:  ?Routine general medical examination at a health care facility - Plan: CBC with Differential/Platelet, Comprehensive metabolic panel, Lipid panel ? 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 ?Hyperlipidemia, unspecified hyperlipidemia type ? ?Allergic rhinitis, mild ? ?Need for Tdap vaccination - Plan: Tdap vaccine greater than or equal to 7yo IM ? ?Encounter for hepatitis C screening test for low risk patient - Plan: Hepatitis C antibody, CANCELED: Hormone Panel ? ?Screening for colon cancer - Plan: Cologuard ? ?Marital stress ?I discussed the diagnosis of prostate cancer with him and his Gleason score of 6.  I think it is reasonable for him to continue with active surveillance and I will help him to run PSA on him.   1 will be set up in July.  I then discussed the marital stress that he is under and the fact that he is involved in counseling.  Strongly encouraged him to continue with that.  I then looked up hormone panel and most of the items and there are either testosterone or male related issues that would not apply.  He is really not having any testosterone related symptoms of weakness, decreased libido, decreased strength. ?PSA ordered for July. ?

## 2021-10-20 NOTE — Telephone Encounter (Signed)
Pt states he would like PSA drawn in July, can you put in orders if this is ok?

## 2021-10-21 LAB — LIPID PANEL
Chol/HDL Ratio: 3.5 ratio (ref 0.0–5.0)
Cholesterol, Total: 218 mg/dL — ABNORMAL HIGH (ref 100–199)
HDL: 62 mg/dL (ref >39)
LDL Chol Calc (NIH): 132 mg/dL — ABNORMAL HIGH (ref 0–99)
Triglycerides: 134 mg/dL (ref 0–149)
VLDL Cholesterol Cal: 24 mg/dL (ref 5–40)

## 2021-10-21 LAB — CBC WITH DIFFERENTIAL/PLATELET
Basophils Absolute: 0 10*3/uL (ref 0.0–0.2)
Basos: 1 %
EOS (ABSOLUTE): 0.4 10*3/uL (ref 0.0–0.4)
Eos: 6 %
Hematocrit: 46.2 % (ref 37.5–51.0)
Hemoglobin: 15.9 g/dL (ref 13.0–17.7)
Immature Grans (Abs): 0 10*3/uL (ref 0.0–0.1)
Immature Granulocytes: 0 %
Lymphocytes Absolute: 1 10*3/uL (ref 0.7–3.1)
Lymphs: 17 %
MCH: 29.9 pg (ref 26.6–33.0)
MCHC: 34.4 g/dL (ref 31.5–35.7)
MCV: 87 fL (ref 79–97)
Monocytes Absolute: 0.4 10*3/uL (ref 0.1–0.9)
Monocytes: 7 %
Neutrophils Absolute: 4.1 10*3/uL (ref 1.4–7.0)
Neutrophils: 69 %
Platelets: 190 10*3/uL (ref 150–450)
RBC: 5.32 x10E6/uL (ref 4.14–5.80)
RDW: 12.1 % (ref 11.6–15.4)
WBC: 5.9 10*3/uL (ref 3.4–10.8)

## 2021-10-21 LAB — COMPREHENSIVE METABOLIC PANEL
ALT: 18 IU/L (ref 0–44)
AST: 16 IU/L (ref 0–40)
Albumin/Globulin Ratio: 2.5 — ABNORMAL HIGH (ref 1.2–2.2)
Albumin: 4.8 g/dL (ref 3.8–4.9)
Alkaline Phosphatase: 58 IU/L (ref 44–121)
BUN/Creatinine Ratio: 14 (ref 9–20)
BUN: 14 mg/dL (ref 6–24)
Bilirubin Total: 0.5 mg/dL (ref 0.0–1.2)
CO2: 24 mmol/L (ref 20–29)
Calcium: 9.8 mg/dL (ref 8.7–10.2)
Chloride: 105 mmol/L (ref 96–106)
Creatinine, Ser: 1.01 mg/dL (ref 0.76–1.27)
Globulin, Total: 1.9 g/dL (ref 1.5–4.5)
Glucose: 93 mg/dL (ref 70–99)
Potassium: 4.3 mmol/L (ref 3.5–5.2)
Sodium: 140 mmol/L (ref 134–144)
Total Protein: 6.7 g/dL (ref 6.0–8.5)
eGFR: 88 mL/min/{1.73_m2} (ref >59)

## 2021-10-21 LAB — HEPATITIS C ANTIBODY: Hep C Virus Ab: NONREACTIVE

## 2021-10-22 ENCOUNTER — Encounter: Payer: Self-pay | Admitting: Family Medicine

## 2021-10-29 ENCOUNTER — Ambulatory Visit
Admission: RE | Admit: 2021-10-29 | Discharge: 2021-10-29 | Disposition: A | Discharge status: Home / Self Care | Payer: BC Managed Care – PPO | Source: Ambulatory Visit | Attending: Urology | Admitting: Urology

## 2021-10-29 DIAGNOSIS — R590 Localized enlarged lymph nodes: Secondary | ICD-10-CM | POA: Diagnosis not present

## 2021-10-29 DIAGNOSIS — C61 Malignant neoplasm of prostate: Secondary | ICD-10-CM

## 2021-10-29 DIAGNOSIS — Z1211 Encounter for screening for malignant neoplasm of colon: Secondary | ICD-10-CM | POA: Diagnosis not present

## 2021-10-29 MED ORDER — GADOBENATE DIMEGLUMINE 529 MG/ML IV SOLN
18.0000 mL | Freq: Once | INTRAVENOUS | Status: AC | PRN
  Administered 2021-10-29: 18 mL via INTRAVENOUS

## 2021-11-03 ENCOUNTER — Other Ambulatory Visit: Payer: BC Managed Care – PPO

## 2021-11-06 LAB — COLOGUARD: COLOGUARD: NEGATIVE

## 2021-11-09 ENCOUNTER — Encounter: Payer: Self-pay | Admitting: Family Medicine

## 2021-11-17 DIAGNOSIS — C61 Malignant neoplasm of prostate: Secondary | ICD-10-CM | POA: Diagnosis not present

## 2021-11-23 ENCOUNTER — Encounter: Payer: Self-pay | Admitting: Family Medicine

## 2022-01-11 ENCOUNTER — Other Ambulatory Visit: Payer: BC Managed Care – PPO

## 2022-03-24 ENCOUNTER — Encounter: Payer: Self-pay | Admitting: Internal Medicine

## 2022-04-27 ENCOUNTER — Encounter: Payer: Self-pay | Admitting: Internal Medicine

## 2022-05-10 ENCOUNTER — Encounter: Payer: Self-pay | Admitting: Internal Medicine

## 2022-05-14 DIAGNOSIS — C61 Malignant neoplasm of prostate: Secondary | ICD-10-CM | POA: Diagnosis not present

## 2022-05-21 DIAGNOSIS — C61 Malignant neoplasm of prostate: Secondary | ICD-10-CM | POA: Diagnosis not present

## 2022-05-28 DIAGNOSIS — C61 Malignant neoplasm of prostate: Secondary | ICD-10-CM | POA: Diagnosis not present

## 2022-06-21 ENCOUNTER — Encounter: Payer: Self-pay | Admitting: Family Medicine

## 2022-06-21 DIAGNOSIS — Z1211 Encounter for screening for malignant neoplasm of colon: Secondary | ICD-10-CM

## 2022-06-22 NOTE — Telephone Encounter (Signed)
Referral order placed. Please schedule referral appointment.

## 2022-07-05 ENCOUNTER — Encounter: Payer: Self-pay | Admitting: Gastroenterology

## 2022-07-14 ENCOUNTER — Ambulatory Visit (AMBULATORY_SURGERY_CENTER): Payer: BC Managed Care – PPO | Admitting: *Deleted

## 2022-07-14 ENCOUNTER — Encounter: Payer: Self-pay | Admitting: Family Medicine

## 2022-07-14 VITALS — Ht 76.0 in | Wt 188.0 lb

## 2022-07-14 DIAGNOSIS — Z1211 Encounter for screening for malignant neoplasm of colon: Secondary | ICD-10-CM

## 2022-07-14 MED ORDER — NA SULFATE-K SULFATE-MG SULF 17.5-3.13-1.6 GM/177ML PO SOLN: 1.0000 | Freq: Once | ORAL | 0 refills | Status: AC

## 2022-07-14 NOTE — Progress Notes (Signed)
No egg or soy allergy known to patient  No issues known to pt with past sedation with any surgeries or procedures Patient denies ever being told they had issues or difficulty with intubation never had done No issues moving neck or head No issues with swallowing No FH of Malignant Hyperthermia Pt is not on diet pills Pt is not on  home 02  Pt is not on blood thinners  Pt denies issues with constipation  Pt is not on dialysis Pt denies any upcoming cardiac testing Pt encouraged to use to use Singlecare or Goodrx to reduce cost  Patient's chart reviewed by Osvaldo Angst CNRA prior to previsit and patient appropriate for the [facility].  Previsit completed and red dot placed by patient's name on their procedure day (on provider's schedule).  . Pre-vsit via phone Instructions sent by mail with coupon

## 2022-07-28 ENCOUNTER — Encounter: Payer: Self-pay | Admitting: Internal Medicine

## 2022-08-09 ENCOUNTER — Encounter: Payer: Self-pay | Admitting: Gastroenterology

## 2022-08-11 ENCOUNTER — Encounter: Payer: Self-pay | Admitting: Gastroenterology

## 2022-08-11 ENCOUNTER — Ambulatory Visit (AMBULATORY_SURGERY_CENTER): Payer: BC Managed Care – PPO | Admitting: Gastroenterology

## 2022-08-11 VITALS — BP 109/72 | HR 54 | Temp 97.1°F | Resp 12 | Ht 75.0 in | Wt 188.0 lb

## 2022-08-11 DIAGNOSIS — Z1211 Encounter for screening for malignant neoplasm of colon: Secondary | ICD-10-CM

## 2022-08-11 DIAGNOSIS — D124 Benign neoplasm of descending colon: Secondary | ICD-10-CM | POA: Diagnosis not present

## 2022-08-11 MED ORDER — SODIUM CHLORIDE 0.9 % IV SOLN: 500.0000 mL | INTRAVENOUS | Status: DC

## 2022-08-11 NOTE — Patient Instructions (Signed)
Handouts on polyps and hemorrhoids given to you today  Await pathology results   YOU HAD AN ENDOSCOPIC PROCEDURE TODAY AT THE [facility]:   Refer to the procedure report that was given to you for any specific questions about what was found during the examination.  If the procedure report does not answer your questions, please call your gastroenterologist to clarify.  If you requested that your care partner not be given the details of your procedure findings, then the procedure report has been included in a sealed envelope for you to review at your convenience later.  YOU SHOULD EXPECT: Some feelings of bloating in the abdomen. Passage of more gas than usual.  Walking can help get rid of the air that was put into your GI tract during the procedure and reduce the bloating. If you had a lower endoscopy (such as a colonoscopy or flexible sigmoidoscopy) you may notice spotting of blood in your stool or on the toilet paper. If you underwent a bowel prep for your procedure, you may not have a normal bowel movement for a few days.  Please Note:  You might notice some irritation and congestion in your nose or some drainage.  This is from the oxygen used during your procedure.  There is no need for concern and it should clear up in a day or so.  SYMPTOMS TO REPORT IMMEDIATELY:  Following lower endoscopy (colonoscopy or flexible sigmoidoscopy):  Excessive amounts of blood in the stool  Significant tenderness or worsening of abdominal pains  Swelling of the abdomen that is new, acute  Fever of 100F or higher  For urgent or emergent issues, a gastroenterologist can be reached at any hour by calling [phone]. Do not use MyChart messaging for urgent concerns.    DIET:  We do recommend a small meal at first, but then you may proceed to your regular diet.  Drink plenty of fluids but you should avoid alcoholic beverages for 24 hours.  ACTIVITY:  You should plan to take it easy for the rest  of today and you should NOT DRIVE or use heavy machinery until tomorrow (because of the sedation medicines used during the test).    FOLLOW UP: Our staff will call the number listed on your records the next business day following your procedure.  We will call around 7:15- 8:00 am to check on you and address any questions or concerns that you may have regarding the information given to you following your procedure. If we do not reach you, we will leave a message.     If any biopsies were taken you will be contacted by phone or by letter within the next 1-3 weeks.  Please call us at [phone] if you have not heard about the biopsies in 3 weeks.    SIGNATURES/CONFIDENTIALITY: You and/or your care partner have signed paperwork which will be entered into your electronic medical record.  These signatures attest to the fact that that the information above on your After Visit Summary has been reviewed and is understood.  Full responsibility of the confidentiality of this discharge information lies with you and/or your care-partner. 

## 2022-08-11 NOTE — Progress Notes (Signed)
To pacu, VSS. Report to [email or web address] 

## 2022-08-11 NOTE — Op Note (Signed)
[facility] Patient Name: Jason Hinton Procedure Date: 08/11/2022 8:19 AM MRN: [id number] Endoscopist: Ladene Artist , MD, [id number] Age: 57 Referring MD:  Date of Birth: 03-Nov-1965 Gender: Male Account #: [id number] Procedure:                Colonoscopy Indications:              Screening for colorectal malignant neoplasm Medicines:                Monitored Anesthesia Care Procedure:                Pre-Anesthesia Assessment:                           - Prior to the procedure, a History and Physical                            was performed, and patient medications and                            allergies were reviewed. The patient's tolerance of                            previous anesthesia was also reviewed. The risks                            and benefits of the procedure and the sedation                            options and risks were discussed with the patient.                            All questions were answered, and informed consent                            was obtained. Prior Anticoagulants: The patient has                            taken no anticoagulant or antiplatelet agents. ASA                            Grade Assessment: II - A patient with mild systemic                            disease. After reviewing the risks and benefits,                            the patient was deemed in satisfactory condition to                            undergo the procedure.                           After obtaining informed consent, the colonoscope  was passed under direct vision. Throughout the                            procedure, the patient's blood pressure, pulse, and                            oxygen saturations were monitored continuously. The                            Olympus Scope [id number] was introduced through the                            anus and advanced to the the cecum, identified by                            appendiceal  orifice and ileocecal valve. The                            ileocecal valve, appendiceal orifice, and rectum                            were photographed. The quality of the bowel                            preparation was good. The colonoscopy was performed                            without difficulty. The patient tolerated the                            procedure well. Scope In: 8:34:03 AM Scope Out: 8:47:56 AM Scope Withdrawal Time: 0 hours 12 minutes 1 second  Total Procedure Duration: 0 hours 13 minutes 53 seconds  Findings:                 The perianal and digital rectal examinations were                            normal.                           An 8 mm polyp was found in the descending colon.                            The polyp was semi-pedunculated. The polyp was                            removed with a cold snare. Resection and retrieval                            were complete.                           Internal hemorrhoids were found during  retroflexion. The hemorrhoids were moderate and                            Grade I (internal hemorrhoids that do not prolapse).                           The exam was otherwise without abnormality on                            direct and retroflexion views. Complications:            No immediate complications. Estimated blood loss:                            None. Estimated Blood Loss:     Estimated blood loss: none. Impression:               - One 8 mm polyp in the descending colon, removed                            with a cold snare. Resected and retrieved.                           - Internal hemorrhoids.                           - The examination was otherwise normal on direct                            and retroflexion views. Recommendation:           - Repeat colonoscopy after studies are complete for                            surveillance based on pathology results.                           -  Patient has a contact number available for                            emergencies. The signs and symptoms of potential                            delayed complications were discussed with the                            patient. Return to normal activities tomorrow.                            Written discharge instructions were provided to the                            patient.                           - Resume previous diet.                           -  Continue present medications.                           - Await pathology results. Ladene Artist, MD 08/11/2022 8:51:18 AM This report has been signed electronically.

## 2022-08-11 NOTE — Progress Notes (Signed)
History & Physical  Primary Care Physician:  Denita Lung, MD Primary Gastroenterologist: Lucio Edward, MD  Impression / Plan:   CRC screening, average risk, for colonoscopy.   CHIEF COMPLAINT:  CRC screening   HPI: Jason Hinton is a 57 y.o. male CRC screening, average risk, for colonoscopy.   Past Medical History:  Diagnosis Date   Allergic rhinitis    Allergy    Cancer ([place])    Prostate cancer [facility])     Past Surgical History:  Procedure Laterality Date   SATURATION BIOPSY OF PROSTATE N/A 08/19/2021    Prior to Admission medications   Medication Sig Start Date End Date Taking? Authorizing Provider  Multiple Vitamin (MULTIVITAMIN) tablet Take 1 tablet by mouth daily.   Yes [provider]  Omega-3 Fatty Acids (FISH OIL) 1200 MG CAPS Take by mouth.   Yes [provider]  zinc gluconate 50 MG tablet Take 50 mg by mouth daily.   Yes [provider]    Current Outpatient Medications  Medication Sig Dispense Refill   Multiple Vitamin (MULTIVITAMIN) tablet Take 1 tablet by mouth daily.     Omega-3 Fatty Acids (FISH OIL) 1200 MG CAPS Take by mouth.     zinc gluconate 50 MG tablet Take 50 mg by mouth daily.     Current Facility-Administered Medications  Medication Dose Route Frequency Provider Last Rate Last Admin   0.9 %  sodium chloride infusion  500 mL Intravenous Continuous Ladene Artist, MD        Allergies as of 08/11/2022   (No Known Allergies)    Family History  Problem Relation Age of Onset   Colon polyps Brother    Colon cancer Neg Hx    Esophageal cancer Neg Hx    Rectal cancer Neg Hx    Stomach cancer Neg Hx     Social History   Socioeconomic History   Marital status: Married    Spouse name: Not on file   Number of children: Not on file   Years of education: Not on file   Highest education level: Not on file  Occupational History   Not on file  Tobacco Use   Smoking status: Never   Smokeless tobacco:  Never  Vaping Use   Vaping Use: Never used  Substance and Sexual Activity   Alcohol use: Yes    Alcohol/week: 5.0 standard drinks of alcohol    Types: 5 Cans of beer per week   Drug use: No   Sexual activity: Yes  Other Topics Concern   Not on file  Social History Narrative   ** Merged History Encounter **       Social Determinants of Health   Financial Resource Strain: Not on file  Food Insecurity: Not on file  Transportation Needs: Not on file  Physical Activity: Not on file  Stress: Not on file  Social Connections: Not on file  Intimate Partner Violence: Not on file    Review of Systems:  All systems reviewed were negative except where noted in HPI.   Physical Exam: General:  Alert, well-developed, in NAD Head:  Normocephalic and atraumatic. Eyes:  Sclera clear, no icterus.   Conjunctiva pink. Ears:  Normal auditory acuity. Mouth:  No deformity or lesions.  Neck:  Supple; no masses. Lungs:  Clear throughout to auscultation.   No wheezes, crackles, or rhonchi. No acute distress. Heart:  Regular rate and rhythm; no murmurs. Abdomen:  Soft, nondistended, nontender. No masses, hepatomegaly.  No obvious masses.  Normal bowel sounds.    Rectal:  Deferred   Msk:  Symmetrical without gross deformities. Pulses:  Normal pulses noted. Extremities:  Without edema. Neurologic:  Alert and  oriented x4;  grossly normal neurologically. Skin:  Intact without significant lesions or rashes. Psych:  Alert and cooperative. Normal mood and affect.   Pricilla Riffle. Fuller Plan  08/11/2022, 8:25 AM See Shea Evans, [facility], to contact our on call provider

## 2022-08-11 NOTE — Progress Notes (Signed)
I have reviewed the patient's medical history in detail and updated the computerized patient record. VS obtained by SM, RN.

## 2022-08-11 NOTE — Progress Notes (Signed)
Called to room to assist during endoscopic procedure.  Patient ID and intended procedure confirmed with present staff. Received instructions for my participation in the procedure from the performing physician.

## 2022-08-12 ENCOUNTER — Telehealth: Payer: Self-pay

## 2022-08-12 NOTE — Telephone Encounter (Signed)
  Follow up Call-     08/11/2022    7:50 AM  Call back number  Post procedure Call Back phone  # [phone]  Permission to leave phone message Yes     Follow up call made.  NALM

## 2022-08-23 ENCOUNTER — Encounter: Payer: Self-pay | Admitting: Gastroenterology

## 2022-09-01 DIAGNOSIS — D126 Benign neoplasm of colon, unspecified: Secondary | ICD-10-CM | POA: Insufficient documentation

## 2022-09-16 DIAGNOSIS — C61 Malignant neoplasm of prostate: Secondary | ICD-10-CM | POA: Diagnosis not present

## 2022-09-24 DIAGNOSIS — C61 Malignant neoplasm of prostate: Secondary | ICD-10-CM | POA: Diagnosis not present

## 2022-09-24 HISTORY — PX: PROSTATE SURGERY: SHX751

## 2022-10-25 DIAGNOSIS — C61 Malignant neoplasm of prostate: Secondary | ICD-10-CM | POA: Diagnosis not present

## 2023-02-15 IMAGING — MR MR PROSTATE WO/W CM
12 series · 48 of 48 positions shown · IV contrast (multihance)
Comparison: None.

CLINICAL DATA: PSA level 4.05 on 06/10/2021. Prostate biopsy
08/19/2021 revealed Gleason 3+3=6 adenocarcinoma in the left lateral
mid gland, left apex, right base, right apex, right lateral base,
and right lateral apex.

EXAM:
MR PROSTATE WITHOUT AND WITH CONTRAST
TECHNIQUE: Multiplanar multisequence MRI images were obtained of the pelvis
centered about the prostate. Pre and post contrast images were
obtained.
CONTRAST:  18mL MULTIHANCE GADOBENATE DIMEGLUMINE 529 MG/ML IV SOLN

[Series 3: T2 · coronal · 3.0mm · 0.56mm/px · 1 of 25 slices shown (1 of 3)]
[im 1/25]
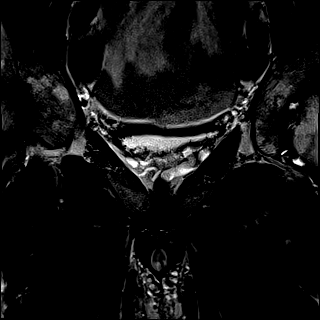

[Series 4: T1 · axial · 5.0mm · 1.25mm/px · 1 of 80 slices shown]
[im 1/80]
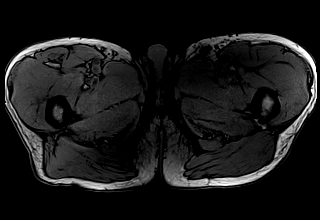

[Series 5: DWI · axial · 3.0mm · 1.75mm/px · z∈[-20,+37]mm · 2 of 60 slices shown (1 of 3)]
[im 1/60]
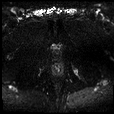
[im 60/60]
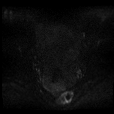

[Series 6: DWI · axial · 3.0mm · 1.75mm/px · 1 of 20 slices shown (2 of 3)]
[im 1/20]
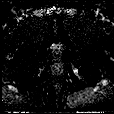

[Series 7: DWI · axial · 3.0mm · 1.75mm/px · 1 of 20 slices shown (3 of 3)]
[im 1/20]
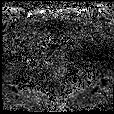

[Series 8: T2 · axial · 3.0mm · 0.56mm/px · 1 of 23 slices shown (2 of 3)]
[im 1/23]
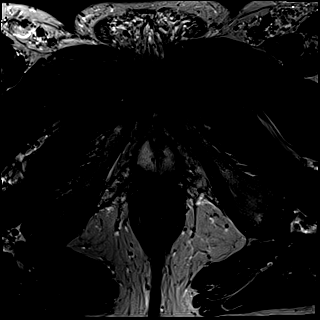

[Series 9: T2 · axial · 1.0mm · 1.04mm/px · z∈[-35,+36]mm · 2 of 72 slices shown (3 of 3)]
[im 1/72]
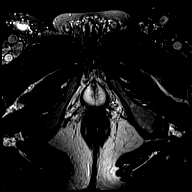
[im 72/72]
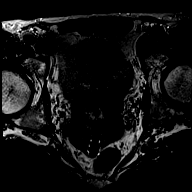

[Series 10: pre t1_twist_tra_dyn · axial · non-contrast · 3.5mm · 0.83mm/px · 1 of 20 slices shown]
[im 1/20]
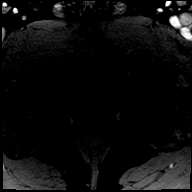

[Series 11: post t1_twist_tra_dyn-copy center · axial · non-contrast · 3.5mm · 0.83mm/px · z∈[-33,+34]mm · 17 of 600 slices shown]
[im 1/600]
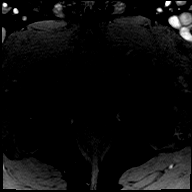
[im 38/600]
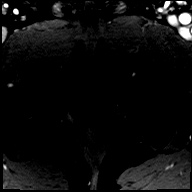
[im 75/600]
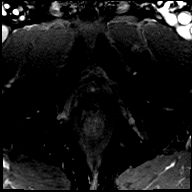
[im 113/600]
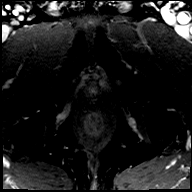
[im 150/600]
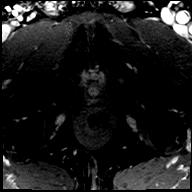
[im 188/600]
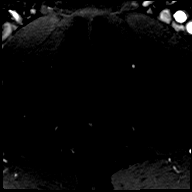
[im 225/600]
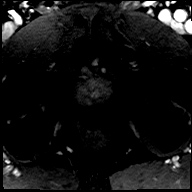
[im 263/600]
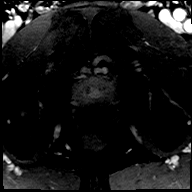
[im 300/600]
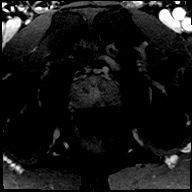
[im 337/600]
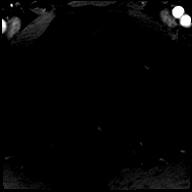
[im 375/600]
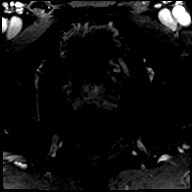
[im 412/600]
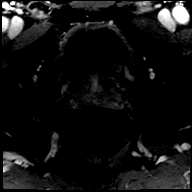
[im 450/600]
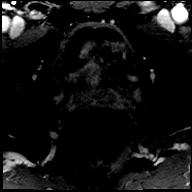
[im 487/600]
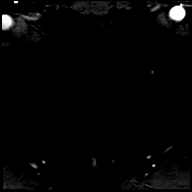
[im 525/600]
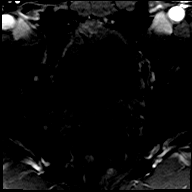
[im 562/600]
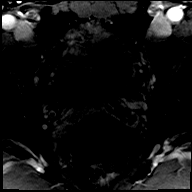
[im 600/600]
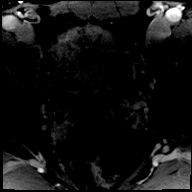

[Series 12: post t1_twist_tra_dyn-copy cent_sub · axial · 3.5mm · 0.83mm/px · z∈[-33,+34]mm · 17 of 580 slices shown]
[im 1/580]
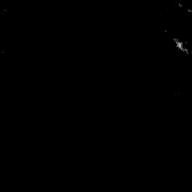
[im 37/580]
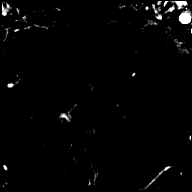
[im 73/580]
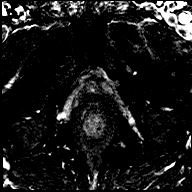
[im 109/580]
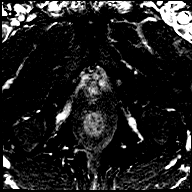
[im 145/580]
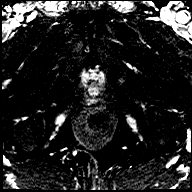
[im 181/580]
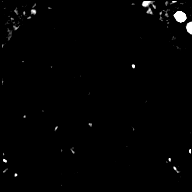
[im 218/580]
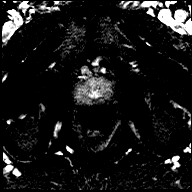
[im 254/580]
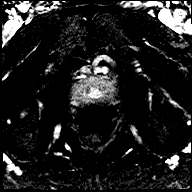
[im 290/580]
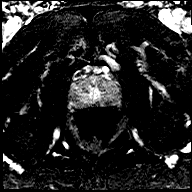
[im 326/580]
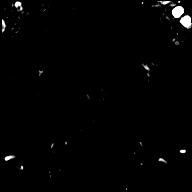
[im 362/580]
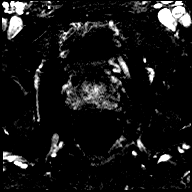
[im 399/580]
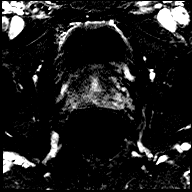
[im 435/580]
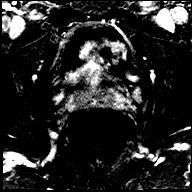
[im 471/580]
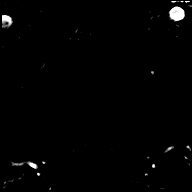
[im 507/580]
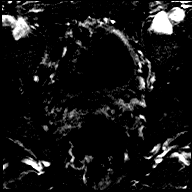
[im 543/580]
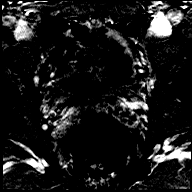
[im 580/580]
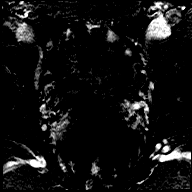

[Series 13: t1_vibe_dixon_tra_f · axial · 2.5mm · 0.91mm/px · z∈[-59,+138]mm · 2 of 80 slices shown]
[im 1/80]
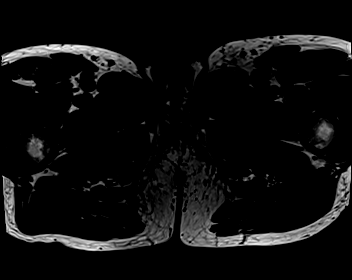
[im 80/80]
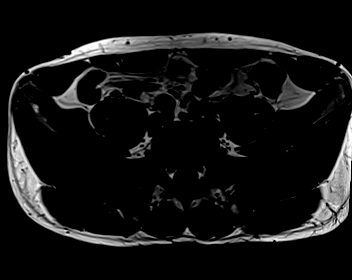

[Series 14: t1_vibe_dixon_tra_w · axial · 2.5mm · 0.91mm/px · z∈[-59,+138]mm · 2 of 80 slices shown]
[im 1/80]
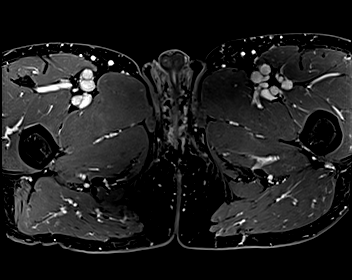
[im 80/80]
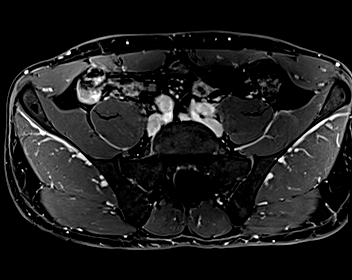

[48 of 48 positions shown; findings below may reference images not displayed]

FINDINGS: Prostate: Diffuse hazy low T2 signal in the peripheral zone of the
prostate gland is nonfocal and likely postinflammatory, considered
PI-RADS category 2.

No substantial restricted diffusion or significant abnormal focal
early enhancement is identified in the prostate gland. No specific
focal lesion intermediate or higher suspicion for prostate cancer by
MRI is observed.

Volume: 3D volumetric assessment: Prostate volume 28.74 cc (4.2 by
3.4 by 3.8 cm).

Transcapsular spread:  Absent

Seminal vesicle involvement: Absent

Neurovascular bundle involvement: Absent

Pelvic adenopathy: Absent

Bone metastasis: Absent

Other findings: No supplemental non-categorized findings.
IMPRESSION: 1. Despite the known Gleason 3+3=6 prostate cancer, no focal lesion
of intermediate or higher MRI suspicion for prostate cancer is
identified. There is diffuse hazy low T2 signal in the peripheral
zone which is nonfocal and likely postinflammatory, considered
PI-RADS category 2.

## 2023-02-21 ENCOUNTER — Ambulatory Visit
Admission: RE | Admit: 2023-02-21 | Discharge: 2023-02-21 | Disposition: A | Discharge status: Home / Self Care | Payer: BC Managed Care – PPO | Source: Ambulatory Visit | Attending: Family Medicine | Admitting: Family Medicine

## 2023-02-21 VITALS — BP 130/79 | HR 54 | Temp 98.2°F | Resp 18 | Ht 75.0 in | Wt 189.0 lb

## 2023-02-21 DIAGNOSIS — H6123 Impacted cerumen, bilateral: Secondary | ICD-10-CM | POA: Diagnosis not present

## 2023-02-21 DIAGNOSIS — H6092 Unspecified otitis externa, left ear: Secondary | ICD-10-CM

## 2023-02-21 MED ORDER — CIPROFLOXACIN-DEXAMETHASONE 0.3-0.1 % OT SUSP: 4.0000 [drp] | Freq: Two times a day (BID) | OTIC | 0 refills | Status: AC

## 2023-02-21 NOTE — ED Triage Notes (Signed)
Patient c/o left ear drainage, pain and discomfort x 1 week.  States that it feels like a swimmers ear but he hasn't been swimming.  Patient has taken OTC ear wax removal drops.

## 2023-02-21 NOTE — Discharge Instructions (Addendum)
Advised patient to instill eardrops as directed.  Encouraged to increase daily water intake to 64 ounces per day while taking this medication.  Advised patient not to submerge head underwater for the next 10 days.  Advised if symptoms worsen and/or unresolved please follow-up with PCP, ENT or here for further evaluation.

## 2023-02-21 NOTE — ED Provider Notes (Signed)
Jason Hinton C[facility]    CSN: [id number] Arrival date & time: 02/21/23  1051      History   Chief Complaint Chief Complaint  Patient presents with   Ear Drainage    I believe I have swimmers ear ? Condition been going on for a week ...clear liquid discharge in left ear . Loss of some hearing in the left ear and sometimes the discomfort is keeping me up at night - Entered by patient    HPI Jason Hinton is a 57 y.o. male.   HPI/Scouras see pleasant 56 year old male presents with left ear drainage and pain with discomfort for 1 week reports it feels like swimmer's ear but he has not been swimming.  Patient has been using OTC ear removal drops.  PMH significant for prostate cancer, HLD and eczema.  Past Medical History:  Diagnosis Date   Allergic rhinitis    Allergy    Cancer (HCC)    Prostate cancer [facility])     Patient Active Problem List   Diagnosis Date Noted   Adenomatous colon polyp 09/01/2022   Prostatic adenocarcinoma (HCC) 09/03/2021   Hyperlipidemia 08/15/2019   Eczema 05/30/2017   Allergic rhinitis, mild 11/26/2011    Past Surgical History:  Procedure Laterality Date   PROSTATE SURGERY  09/24/2022   Prostate needle biopsy   SATURATION BIOPSY OF PROSTATE N/A 08/19/2021       Home Medications    Prior to Admission medications   Medication Sig Start Date End Date Taking? Authorizing Provider  ciprofloxacin-dexamethasone (CIPRODEX) OTIC suspension Place 4 drops into the left ear 2 (two) times daily for 7 days. 02/21/23 02/28/23 Yes Trevor Iha, FNP  Multiple Vitamin (MULTIVITAMIN) tablet Take 1 tablet by mouth daily.   Yes [provider]  Omega-3 Fatty Acids (FISH OIL) 1200 MG CAPS Take by mouth.    [provider]  zinc gluconate 50 MG tablet Take 50 mg by mouth daily.    [provider]    Family History Family History  Problem Relation Age of Onset   Colon polyps Brother    Colon cancer Neg Hx    Esophageal cancer  Neg Hx    Rectal cancer Neg Hx    Stomach cancer Neg Hx     Social History Social History   Tobacco Use   Smoking status: Never   Smokeless tobacco: Never  Vaping Use   Vaping status: Never Used  Substance Use Topics   Alcohol use: Yes    Alcohol/week: 5.0 standard drinks of alcohol    Types: 5 Cans of beer per week   Drug use: No     Allergies   Patient has no known allergies.   Review of Systems Review of Systems   Physical Exam Triage Vital Signs ED Triage Vitals  Encounter Vitals Group     BP 02/21/23 1115 130/79     Systolic BP Percentile --      Diastolic BP Percentile --      Pulse Rate 02/21/23 1115 (!) 54     Resp 02/21/23 1115 18     Temp 02/21/23 1115 98.2 F (36.8 C)     Temp Source 02/21/23 1115 Oral     SpO2 02/21/23 1115 97 %     Weight 02/21/23 1117 189 lb (85.7 kg)     Height 02/21/23 1117 6\' 3"  (1.905 m)     Head Circumference --      Peak Flow --  Pain Score 02/21/23 1116 6     Pain Loc --      Pain Education --      Exclude from Growth Chart --    No data found.  Updated Vital Signs BP 130/79 (BP Location: Right Arm)   Pulse (!) 54   Temp 98.2 F (36.8 C) (Oral)   Resp 18   Ht 6\' 3"  (1.905 m)   Wt 189 lb (85.7 kg)   SpO2 97%   BMI 23.62 kg/m    Physical Exam Vitals and nursing note reviewed.  Constitutional:      General: He is not in acute distress.    Appearance: Normal appearance. He is normal weight. He is not ill-appearing.  HENT:     Head: Normocephalic and atraumatic.     Right Ear: External ear normal.     Left Ear: External ear normal.     Ears:     Comments: Bilateral EACs occluded with excessive cerumen unable to visualize either TM.  Post bilateral ear lavage: Right EAC-clear, mildly erythematous from previous cerumen impaction, right TM-clear, good light reflex with mobility; left EAC-erythematous, inflamed, left TM-clear, good light reflex with mobility    Mouth/Throat:     Mouth: Mucous membranes are  moist.     Pharynx: Oropharynx is clear.  Eyes:     Extraocular Movements: Extraocular movements intact.     Conjunctiva/sclera: Conjunctivae normal.     Pupils: Pupils are equal, round, and reactive to light.  Cardiovascular:     Rate and Rhythm: Normal rate and regular rhythm.     Pulses: Normal pulses.     Heart sounds: Normal heart sounds.  Pulmonary:     Effort: Pulmonary effort is normal.     Breath sounds: Normal breath sounds. No wheezing or rhonchi.  Musculoskeletal:        General: Normal range of motion.     Cervical back: Normal range of motion and neck supple.  Skin:    General: Skin is warm and dry.  Neurological:     General: No focal deficit present.     Mental Status: He is alert and oriented to person, place, and time. Mental status is at baseline.  Psychiatric:        Mood and Affect: Mood normal.        Behavior: Behavior normal.        Thought Content: Thought content normal.      UC Treatments / Results  Labs (all labs ordered are listed, but only abnormal results are displayed) Labs Reviewed - No data to display  EKG   Radiology No results found.  Procedures Procedures (including critical care time)  Medications Ordered in UC Medications - No data to display  Initial Impression / Assessment and Plan / UC Course  I have reviewed the triage vital signs and the nursing notes.  Pertinent labs & imaging results that were available during my care of the patient were reviewed by me and considered in my medical decision making (see chart for details).     MDM: 1.  Otitis externa of left ear, unspecified chronicity, unspecified type Rx Ciprodex otic suspension: Place 4 drops into left ear twice daily x 7 days.  2.  Bilateral impacted cerumen-resolved with bilateral ear lavage. Advised patient to instill eardrops as directed.  Encouraged to increase daily water intake to 64 ounces per day while taking this medication.  Advised patient not to submerge  head underwater for the next 10  days.  Advised if symptoms worsen and/or unresolved please follow-up with PCP, ENT or here for further evaluation.  Patient discharged home, hemodynamically stable. Final Clinical Impressions(s) / UC Diagnoses   Final diagnoses:  Bilateral impacted cerumen  Otitis externa of left ear, unspecified chronicity, unspecified type     Discharge Instructions      Advised patient to instill eardrops as directed.  Encouraged to increase daily water intake to 64 ounces per day while taking this medication.  Advised patient not to submerge head underwater for the next 10 days.  Advised if symptoms worsen and/or unresolved please follow-up with PCP, ENT or here for further evaluation.     ED Prescriptions     Medication Sig Dispense Auth. Provider   ciprofloxacin-dexamethasone (CIPRODEX) OTIC suspension Place 4 drops into the left ear 2 (two) times daily for 7 days. 7.5 mL Trevor Iha, FNP      PDMP not reviewed this encounter.   Trevor Iha, FNP 02/21/23 1254

## 2023-03-02 DIAGNOSIS — D2261 Melanocytic nevi of right upper limb, including shoulder: Secondary | ICD-10-CM | POA: Diagnosis not present

## 2023-03-02 DIAGNOSIS — D225 Melanocytic nevi of trunk: Secondary | ICD-10-CM | POA: Diagnosis not present

## 2023-03-02 DIAGNOSIS — D224 Melanocytic nevi of scalp and neck: Secondary | ICD-10-CM | POA: Diagnosis not present

## 2023-03-02 DIAGNOSIS — D2262 Melanocytic nevi of left upper limb, including shoulder: Secondary | ICD-10-CM | POA: Diagnosis not present

## 2023-04-29 DIAGNOSIS — C61 Malignant neoplasm of prostate: Secondary | ICD-10-CM | POA: Diagnosis not present

## 2023-05-06 DIAGNOSIS — C61 Malignant neoplasm of prostate: Secondary | ICD-10-CM | POA: Diagnosis not present

## 2023-07-07 ENCOUNTER — Encounter: Payer: Self-pay | Admitting: Family Medicine

## 2023-07-07 ENCOUNTER — Ambulatory Visit (INDEPENDENT_AMBULATORY_CARE_PROVIDER_SITE_OTHER): Payer: BC Managed Care – PPO | Admitting: Family Medicine

## 2023-07-07 VITALS — BP 110/70 | HR 65 | Ht 75.0 in | Wt 196.8 lb

## 2023-07-07 DIAGNOSIS — Z23 Encounter for immunization: Secondary | ICD-10-CM

## 2023-07-07 DIAGNOSIS — E782 Mixed hyperlipidemia: Secondary | ICD-10-CM

## 2023-07-07 DIAGNOSIS — J309 Allergic rhinitis, unspecified: Secondary | ICD-10-CM

## 2023-07-07 DIAGNOSIS — Z0000 Encounter for general adult medical examination without abnormal findings: Secondary | ICD-10-CM | POA: Diagnosis not present

## 2023-07-07 DIAGNOSIS — D126 Benign neoplasm of colon, unspecified: Secondary | ICD-10-CM | POA: Diagnosis not present

## 2023-07-07 DIAGNOSIS — L308 Other specified dermatitis: Secondary | ICD-10-CM

## 2023-07-07 DIAGNOSIS — C61 Malignant neoplasm of prostate: Secondary | ICD-10-CM

## 2023-07-07 DIAGNOSIS — G479 Sleep disorder, unspecified: Secondary | ICD-10-CM

## 2023-07-07 MED ORDER — ZOLPIDEM TARTRATE ER 12.5 MG PO TBCR: 12.5000 mg | EXTENDED_RELEASE_TABLET | Freq: Every evening | ORAL | 0 refills | Status: AC | PRN

## 2023-07-07 NOTE — Progress Notes (Signed)
Complete physical exam  Patient: Jason Hinton   DOB: [birth year]   57 y.o. Male  MRN: [id number]  Subjective:    Chief Complaint  Patient presents with   Annual Exam   Eye Problem    Jason Hinton is a 57 y.o. male who presents today for a complete physical exam. He reports consuming a general diet.  His exercise includes running, biking, walking, and soccer.  He generally feels fairly well. He reports sleeping fairly well although he would like to have some sleep medications to help deal with the stress that he is under.  He and his wife are in the process of separating and getting a divorce and then he had to deal with prostate cancer and more recently she had to deal with a cardiac tumor which put everything into a tailspin.  His Gleason score was 6 and he is now into the active surveillance process and getting regular checkups including getting an MRI and follow-up PSAs.  He seems to be handling this fairly well.  The situation with his wife is more or less on hold but think they are eventually will go ahead and get a divorce.  They seem to have come to a separations of the ways and they are not really having any major issues with this.  They do have 1 daughter.  His allergies are under good control.  He did have an abnormal Scott polyp and that his been taking care of.  Most recent fall risk assessment:    07/07/2023    3:32 PM  Fall Risk   Falls in the past year? 0  Number falls in past yr: 0  Injury with Fall? 0     Most recent depression screenings:    07/07/2023    3:32 PM 10/20/2021    3:23 PM  PHQ 2/9 Scores  PHQ - 2 Score 0 0    Vision:Not within last year  and Dental: No current dental problems and Receives regular dental care    Patient Care Team: Ronnald Nian, MD as PCP - General (Family Medicine)   Outpatient Medications Prior to Visit  Medication Sig   cholecalciferol (VITAMIN D3) 25 MCG (1000 UNIT) tablet Take 1,000 Units by mouth daily.    Multiple Vitamin (MULTIVITAMIN) tablet Take 1 tablet by mouth daily.   Omega-3 Fatty Acids (FISH OIL) 1200 MG CAPS Take by mouth. (Patient not taking: Reported on 07/07/2023)   zinc gluconate 50 MG tablet Take 50 mg by mouth daily. (Patient not taking: Reported on 07/07/2023)   No facility-administered medications prior to visit.    Review of Systems  All other systems reviewed and are negative. Family and social as well as health maintenance immunizations was reviewed        Objective:        Physical Exam  Alert and in no distress. Tympanic membranes and canals are normal. Pharyngeal area is normal. Neck is supple without adenopathy or thyromegaly. Cardiac exam shows a regular sinus rhythm without murmurs or gallops. Lungs are clear to auscultation.       Assessment & Plan:    Routine general medical examination at a health care facility - Plan: CBC with Differential/Platelet, Comprehensive metabolic panel, Lipid panel  Adenomatous polyp of colon, unspecified part of colon  Allergic rhinitis, mild  Mixed hyperlipidemia - Plan: Lipid panel  Prostatic adenocarcinoma (HCC)  Need for Tdap vaccination - Plan: Tdap vaccine greater than or equal to 7yo IM  Sleep disturbance - Plan: zolpidem (AMBIEN CR) 12.5 MG CR tablet  Immunization History  Administered Date(s) Administered   Influenza Split 05/09/2015   Influenza,inj,Quad PF,6+ Mos 06/01/2018, 08/15/2019   Influenza-Unspecified 05/11/2021, 04/18/2022   Tdap 11/26/2011, 07/07/2023    Health Maintenance  Topic Date Due   COVID-19 Vaccine (1) Never done   HIV Screening  Never done   Zoster Vaccines- Shingrix (1 of 2) Never done   INFLUENZA VACCINE  10/17/2023 (Originally 02/17/2023)   Fecal DNA (Cologuard)  10/29/2024   DTaP/Tdap/Td (3 - Td or Tdap) 07/06/2033   Hepatitis C Screening  Completed   HPV VACCINES  Aged Out    Discussed how he is handling the prostate cancer as well as his personal life and  impending divorce.  He seems have a good handle on all this.  He will continue an active surveillance.  I will give him Ambien that he plans to use maybe once or twice per month.  Will call me if he has further problems.  Problem List Items Addressed This Visit     Adenomatous colon polyp   Allergic rhinitis, mild   Hyperlipidemia   Relevant Orders   Lipid panel   Prostatic adenocarcinoma (HCC)   Other Visit Diagnoses       Routine general medical examination at a health care facility    -  Primary   Relevant Orders   CBC with Differential/Platelet   Comprehensive metabolic panel   Lipid panel     Need for Tdap vaccination       Relevant Orders   Tdap vaccine greater than or equal to 7yo IM (Completed)     Sleep disturbance       Relevant Medications   zolpidem (AMBIEN CR) 12.5 MG CR tablet      No follow-ups on file.     Sharlot Gowda, MD

## 2023-07-08 LAB — COMPREHENSIVE METABOLIC PANEL
ALT: 16 [IU]/L (ref 0–44)
AST: 13 [IU]/L (ref 0–40)
Albumin: 4.3 g/dL (ref 3.8–4.9)
Alkaline Phosphatase: 61 [IU]/L (ref 44–121)
BUN/Creatinine Ratio: 16 (ref 9–20)
BUN: 17 mg/dL (ref 6–24)
Bilirubin Total: 0.3 mg/dL (ref 0.0–1.2)
CO2: 20 mmol/L (ref 20–29)
Calcium: 9.6 mg/dL (ref 8.7–10.2)
Chloride: 106 mmol/L (ref 96–106)
Creatinine, Ser: 1.09 mg/dL (ref 0.76–1.27)
Globulin, Total: 2.2 g/dL (ref 1.5–4.5)
Glucose: 81 mg/dL (ref 70–99)
Potassium: 4.2 mmol/L (ref 3.5–5.2)
Sodium: 143 mmol/L (ref 134–144)
Total Protein: 6.5 g/dL (ref 6.0–8.5)
eGFR: 80 mL/min/{1.73_m2} (ref >59)

## 2023-07-08 LAB — CBC WITH DIFFERENTIAL/PLATELET
Basophils Absolute: 0 10*3/uL (ref 0.0–0.2)
Basos: 0 %
EOS (ABSOLUTE): 0.4 10*3/uL (ref 0.0–0.4)
Eos: 6 %
Hematocrit: 43.7 % (ref 37.5–51.0)
Hemoglobin: 14.9 g/dL (ref 13.0–17.7)
Immature Grans (Abs): 0 10*3/uL (ref 0.0–0.1)
Immature Granulocytes: 0 %
Lymphocytes Absolute: 1.1 10*3/uL (ref 0.7–3.1)
Lymphs: 17 %
MCH: 29.9 pg (ref 26.6–33.0)
MCHC: 34.1 g/dL (ref 31.5–35.7)
MCV: 88 fL (ref 79–97)
Monocytes Absolute: 0.5 10*3/uL (ref 0.1–0.9)
Monocytes: 8 %
Neutrophils Absolute: 4.3 10*3/uL (ref 1.4–7.0)
Neutrophils: 69 %
Platelets: 149 10*3/uL — ABNORMAL LOW (ref 150–450)
RBC: 4.98 x10E6/uL (ref 4.14–5.80)
RDW: 12 % (ref 11.6–15.4)
WBC: 6.2 10*3/uL (ref 3.4–10.8)

## 2023-07-08 LAB — LIPID PANEL
Chol/HDL Ratio: 4 {ratio} (ref 0.0–5.0)
Cholesterol, Total: 194 mg/dL (ref 100–199)
HDL: 49 mg/dL (ref >39)
LDL Chol Calc (NIH): 107 mg/dL — ABNORMAL HIGH (ref 0–99)
Triglycerides: 223 mg/dL — ABNORMAL HIGH (ref 0–149)
VLDL Cholesterol Cal: 38 mg/dL (ref 5–40)

## 2023-07-11 ENCOUNTER — Encounter: Payer: Self-pay | Admitting: Family Medicine

## 2023-09-09 ENCOUNTER — Other Ambulatory Visit: Payer: Self-pay | Admitting: Urology

## 2023-09-09 DIAGNOSIS — C61 Malignant neoplasm of prostate: Secondary | ICD-10-CM

## 2023-09-13 ENCOUNTER — Encounter: Payer: Self-pay | Admitting: Internal Medicine

## 2023-11-22 ENCOUNTER — Encounter: Payer: Self-pay | Admitting: Urology

## 2023-12-19 ENCOUNTER — Ambulatory Visit
Admission: RE | Admit: 2023-12-19 | Discharge: 2023-12-19 | Disposition: A | Discharge status: Home / Self Care | Payer: BC Managed Care – PPO | Source: Ambulatory Visit | Attending: Urology | Admitting: Urology

## 2023-12-19 DIAGNOSIS — C61 Malignant neoplasm of prostate: Secondary | ICD-10-CM

## 2023-12-19 DIAGNOSIS — R972 Elevated prostate specific antigen [PSA]: Secondary | ICD-10-CM | POA: Diagnosis not present

## 2023-12-19 MED ORDER — GADOPICLENOL 0.5 MMOL/ML IV SOLN
9.0000 mL | Freq: Once | INTRAVENOUS | Status: AC | PRN
  Administered 2023-12-19: 9 mL via INTRAVENOUS

## 2024-01-04 DIAGNOSIS — C61 Malignant neoplasm of prostate: Secondary | ICD-10-CM | POA: Diagnosis not present

## 2024-01-11 DIAGNOSIS — C61 Malignant neoplasm of prostate: Secondary | ICD-10-CM | POA: Diagnosis not present

## 2024-02-09 DIAGNOSIS — C61 Malignant neoplasm of prostate: Secondary | ICD-10-CM | POA: Diagnosis not present

## 2024-02-09 DIAGNOSIS — N4289 Other specified disorders of prostate: Secondary | ICD-10-CM | POA: Diagnosis not present

## 2024-07-31 ENCOUNTER — Ambulatory Visit: Payer: BC Managed Care – PPO | Admitting: Family Medicine

## 2024-07-31 ENCOUNTER — Encounter: Payer: Self-pay | Admitting: Family Medicine

## 2024-07-31 VITALS — BP 112/66 | HR 61 | Ht 75.0 in | Wt 194.8 lb

## 2024-07-31 DIAGNOSIS — M722 Plantar fascial fibromatosis: Secondary | ICD-10-CM | POA: Diagnosis not present

## 2024-07-31 DIAGNOSIS — Z136 Encounter for screening for cardiovascular disorders: Secondary | ICD-10-CM | POA: Diagnosis not present

## 2024-07-31 DIAGNOSIS — Z0000 Encounter for general adult medical examination without abnormal findings: Secondary | ICD-10-CM | POA: Diagnosis not present

## 2024-07-31 DIAGNOSIS — G479 Sleep disorder, unspecified: Secondary | ICD-10-CM | POA: Diagnosis not present

## 2024-07-31 DIAGNOSIS — E782 Mixed hyperlipidemia: Secondary | ICD-10-CM

## 2024-07-31 DIAGNOSIS — C61 Malignant neoplasm of prostate: Secondary | ICD-10-CM | POA: Diagnosis not present

## 2024-07-31 LAB — COMPREHENSIVE METABOLIC PANEL WITH GFR
ALT: 24 IU/L (ref 0–44)
AST: 19 IU/L (ref 0–40)
Albumin: 4.3 g/dL (ref 3.8–4.9)
Alkaline Phosphatase: 53 IU/L (ref 47–123)
BUN/Creatinine Ratio: 15 (ref 9–20)
BUN: 15 mg/dL (ref 6–24)
Bilirubin Total: 0.3 mg/dL (ref 0.0–1.2)
CO2: 22 mmol/L (ref 20–29)
Calcium: 9.7 mg/dL (ref 8.7–10.2)
Chloride: 105 mmol/L (ref 96–106)
Creatinine, Ser: 0.98 mg/dL (ref 0.76–1.27)
Globulin, Total: 1.9 g/dL (ref 1.5–4.5)
Glucose: 87 mg/dL (ref 70–99)
Potassium: 4.2 mmol/L (ref 3.5–5.2)
Sodium: 142 mmol/L (ref 134–144)
Total Protein: 6.2 g/dL (ref 6.0–8.5)
eGFR: 90 mL/min/1.73

## 2024-07-31 LAB — CBC WITH DIFFERENTIAL/PLATELET
Basophils Absolute: 0 x10E3/uL (ref 0.0–0.2)
Basos: 0 %
EOS (ABSOLUTE): 0.3 x10E3/uL (ref 0.0–0.4)
Eos: 6 %
Hematocrit: 42.2 % (ref 37.5–51.0)
Hemoglobin: 14.6 g/dL (ref 13.0–17.7)
Immature Grans (Abs): 0 x10E3/uL (ref 0.0–0.1)
Immature Granulocytes: 0 %
Lymphocytes Absolute: 1.1 x10E3/uL (ref 0.7–3.1)
Lymphs: 20 %
MCH: 31.3 pg (ref 26.6–33.0)
MCHC: 34.6 g/dL (ref 31.5–35.7)
MCV: 90 fL (ref 79–97)
Monocytes Absolute: 0.5 x10E3/uL (ref 0.1–0.9)
Monocytes: 9 %
Neutrophils Absolute: 3.6 x10E3/uL (ref 1.4–7.0)
Neutrophils: 65 %
Platelets: 183 x10E3/uL (ref 150–450)
RBC: 4.67 x10E6/uL (ref 4.14–5.80)
RDW: 12 % (ref 11.6–15.4)
WBC: 5.5 x10E3/uL (ref 3.4–10.8)

## 2024-07-31 LAB — LIPID PANEL
Chol/HDL Ratio: 3.6 ratio (ref 0.0–5.0)
Cholesterol, Total: 182 mg/dL (ref 100–199)
HDL: 50 mg/dL
LDL Chol Calc (NIH): 92 mg/dL (ref 0–99)
Triglycerides: 238 mg/dL — ABNORMAL HIGH (ref 0–149)
VLDL Cholesterol Cal: 40 mg/dL (ref 5–40)

## 2024-07-31 MED ORDER — MELOXICAM 15 MG PO TABS: 15.0000 mg | ORAL_TABLET | Freq: Every day | ORAL | 0 refills | Status: AC

## 2024-07-31 NOTE — Progress Notes (Signed)
" ° °  Name: Jason Hinton   Date of Visit: 07/31/2024   Date of last visit with me: Visit date not found   CHIEF COMPLAINT:  Chief Complaint  Patient presents with   Annual Exam    Cpe.        HPI:  Discussed the use of AI scribe software for clinical note transcription with the patient, who gave verbal consent to proceed.  History of Present Illness   Jason Hinton is a 59 year old male who presents for a routine follow-up as part of a work health plan.  He has a history of prostate cancer managed with active surveillance, including MRI and biopsies every two years and PSA level checks two to three times a year.  He has high LDL cholesterol, which he attributes to genetics. He is not on any cholesterol-lowering medication. He maintains an active lifestyle, exercising daily and playing competitive soccer, with plans to participate in a tournament in [place] .  His platelet count has consistently been low, but he has not experienced any symptoms such as bruising.  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         OBJECTIVE:       07/31/2024    3:19 PM  Depression screen PHQ 2/9  Decreased Interest 0  Down, Depressed, Hopeless 0  PHQ - 2 Score 0     BP Readings from Last 3 Encounters:  07/31/24 112/66  07/07/23 110/70  02/21/23 130/79    BP 112/66   Pulse 61   Ht 6' 3 (1.905 m)   Wt 194 lb 12.8 oz (88.4 kg)   SpO2 96%   BMI 24.35 kg/m    Physical Exam          Physical Exam Constitutional:      Appearance: Normal appearance.  Neurological:     General: No focal deficit present.     Mental Status: He is alert and oriented to person, place, and time. Mental status is at baseline.     ASSESSMENT/PLAN:   Assessment & Plan Annual physical exam  Encounter for screening for cardiovascular disorders  Sleep disturbance  Mixed hyperlipidemia  Prostatic adenocarcinoma  (HCC)  Plantar fasciitis    Assessment and Plan    Mixed hyperlipidemia Chronic mixed hyperlipidemia, likely genetic, with elevated LDL. Discussed statins for plaque reduction and cardiovascular prevention. Explained side effects and management strategies. Emphasized statin therapy for cardiovascular risk mitigation. - Repeated lipid panel today. - Consider starting middle dose statin.  Prostatic adenocarcinoma Managed with active surveillance. Regular MRI and biopsies. PSA monitored. No current lab concerns. - Continue active surveillance with urology. - Follow up with urology after MRI on February 9th.  Plantar fascitis - Meloxicam  and sports medicine referral  General Health Maintenance Engages in regular exercise and maintains healthy weight. No concerns with platelet levels. - Continue regular exercise and healthy lifestyle.  -Comprehensive annual physical exam completed today. Reviewed interval history, current medical issues, medications, allergies, and preventive care needs. Addressed all patient questions and concerns. Discussed lifestyle factors including diet, exercise, sleep, and stress management. Reviewed recommended age-appropriate screenings, labs, and vaccinations. Counseling provided on healthy habits and routine health maintenance. Follow-up as indicated based on findings and results.         Liesa Tsan A. Vita MD [facility] "

## 2024-08-01 ENCOUNTER — Ambulatory Visit: Payer: Self-pay | Admitting: Family Medicine

## 2025-08-01 ENCOUNTER — Encounter: Admitting: Family Medicine
